# Patient Record
Sex: Female | Born: 1970
Health system: Southern US, Community
[De-identification: ages and names within clinical notes are randomized; demographics above are authoritative.]

## PROBLEM LIST (undated history)

## (undated) DIAGNOSIS — I10 Essential (primary) hypertension: Secondary | ICD-10-CM

## (undated) DIAGNOSIS — E119 Type 2 diabetes mellitus without complications: Secondary | ICD-10-CM

## (undated) DIAGNOSIS — E669 Obesity, unspecified: Secondary | ICD-10-CM

## (undated) HISTORY — PX: ROTATOR CUFF REPAIR: SHX139

## (undated) HISTORY — DX: Obesity, unspecified: E66.9

## (undated) HISTORY — DX: Essential (primary) hypertension: I10

## (undated) HISTORY — PX: TUBAL LIGATION: SHX77

## (undated) HISTORY — DX: Type 2 diabetes mellitus without complications: E11.9

## (undated) HISTORY — PX: HERNIA REPAIR: SHX51

---

## 1997-09-05 ENCOUNTER — Ambulatory Visit (HOSPITAL_COMMUNITY): Admission: RE | Admit: 1997-09-05 | Discharge: 1997-09-05 | Payer: Self-pay | Admitting: Obstetrics

## 1997-10-25 ENCOUNTER — Ambulatory Visit (HOSPITAL_COMMUNITY): Admission: RE | Admit: 1997-10-25 | Discharge: 1997-10-25 | Payer: Self-pay | Admitting: Obstetrics

## 1997-12-14 ENCOUNTER — Inpatient Hospital Stay (HOSPITAL_COMMUNITY): Admission: AD | Admit: 1997-12-14 | Discharge: 1997-12-14 | Payer: Self-pay | Admitting: Obstetrics

## 1998-01-03 ENCOUNTER — Other Ambulatory Visit: Admission: RE | Admit: 1998-01-03 | Discharge: 1998-01-03 | Payer: Self-pay | Admitting: Obstetrics

## 1998-01-06 ENCOUNTER — Encounter (HOSPITAL_COMMUNITY): Admission: RE | Admit: 1998-01-06 | Discharge: 1998-01-25 | Payer: Self-pay | Admitting: Obstetrics

## 1998-01-24 ENCOUNTER — Inpatient Hospital Stay (HOSPITAL_COMMUNITY): Admission: AD | Admit: 1998-01-24 | Discharge: 1998-01-27 | Payer: Self-pay | Admitting: Obstetrics

## 1998-03-23 ENCOUNTER — Emergency Department (HOSPITAL_COMMUNITY): Admission: EM | Admit: 1998-03-23 | Discharge: 1998-03-23 | Payer: Self-pay | Admitting: Emergency Medicine

## 2006-12-09 ENCOUNTER — Other Ambulatory Visit: Admission: RE | Admit: 2006-12-09 | Discharge: 2006-12-09 | Payer: Self-pay | Admitting: Family Medicine

## 2007-03-03 ENCOUNTER — Ambulatory Visit (HOSPITAL_COMMUNITY): Admission: RE | Admit: 2007-03-03 | Discharge: 2007-03-03 | Payer: Self-pay | Admitting: General Surgery

## 2007-09-22 ENCOUNTER — Emergency Department (HOSPITAL_COMMUNITY): Admission: EM | Admit: 2007-09-22 | Discharge: 2007-09-22 | Payer: Self-pay | Admitting: Family Medicine

## 2010-11-06 NOTE — Op Note (Signed)
Laura Gallegos, Laura Gallegos              ACCOUNT NO.:  [id number]   MEDICAL RECORD NO.:  [id number]          PATIENT TYPE:  AMB   LOCATION:  [facility]                         FACILITY:  [facility]   PHYSICIAN:  Leonie Man, M.D.   DATE OF BIRTH:  09/28/1970   DATE OF PROCEDURE:  03/03/2007  DATE OF DISCHARGE:                               OPERATIVE REPORT   PREOPERATIVE DIAGNOSIS:  Ventral umbilical hernia.   POSTOPERATIVE DIAGNOSIS:  Ventral umbilical hernia.   PROCEDURE:  Repair of incarcerated ventral umbilical hernia with mesh.   SURGEON:  Leonie Man, M.D.   ASSISTANT:  OR nurse.   ANESTHESIA:  General.   NOTE:  The patient is a 40 year old morbidly obese young woman who  presents with 3 episodes of epigastric discomfort, which are increased  after eating, associated with mild nausea but without vomiting.  On  evaluation by her primary care physician who felt she could palpate a  hernia with in the region of the umbilicus, she was referred for further  evaluation and treatment.  At the time of evaluation, she is noted to  have an epigastric hernia which extends all the way down into the  umbilicus.  The patient comes to the operating room now for repair.  She  understands the risks and potential benefits of surgery and she gives  her consent to same.   PROCEDURE:  The patient is positioned supinely and following the  induction of satisfactory general anesthesia, the abdomen is prepped and  draped to be included in the sterile operative field.  A midline  incision is carried down from within the umbilicus extending superiorly  into the epigastrium, deepened through the skin and subcutaneous tissues  and carried down through the region of the hernia.  The hernia was  identified and has multiple large portion of incarcerated omentum, which  is reduced back into the peritoneal cavity, having extended the hernial  defect slightly superiorly so as to accomplish this.  All adhesions to  the side wall were taken down and I then introduced a medium sized  Ventralex mesh into the defect and securing it on all sides to the  abdominal wall and trimming the tails off after they had been sewn to  the fascia.  This was accomplished with 0 Novafil sutures.  The fascia  was then closed over the mesh with a suture catching the mesh portion to  hold it against the abdominal wall with 0 Novafil.  This having been  accomplished, sponge, instruments and sharp counts were doubly verified.  The subcutaneous tissue was then closed with interrupted 3-0, 2-0 Vicryl  sutures and the skin was closed with a 4-0 Monocryl running  intradermal suture.  The wound was then reinforced with Steri-Strips,  sterile dressings applied, the anesthetic reversed and the patient  removed from the operating room to the recovery room in stable  condition.  She tolerated the procedure well.      Leonie Man, M.D.  Electronically Signed     PB/MEDQ  D:  03/03/2007  T:  03/03/2007  Job:  [id number]   cc:  Leonie Man, M.D.

## 2011-04-05 LAB — COMPREHENSIVE METABOLIC PANEL
ALT: 32
AST: 29
Albumin: 3.5
CO2: 29
Chloride: 106
Creatinine, Ser: 0.93
GFR calc Af Amer: >60
GFR calc non Af Amer: >60
Sodium: 140
Total Bilirubin: 0.7

## 2011-04-05 LAB — URINE CULTURE: Colony Count: 85000

## 2011-04-05 LAB — DIFFERENTIAL
Basophils Absolute: 0.1
Eosinophils Absolute: 0.2
Eosinophils Relative: 3
Lymphocytes Relative: 33
Lymphs Abs: 2.5
Monocytes Absolute: 0.4

## 2011-04-05 LAB — URINALYSIS, ROUTINE W REFLEX MICROSCOPIC
Glucose, UA: NEGATIVE
Hgb urine dipstick: NEGATIVE
pH: 5.5

## 2011-04-05 LAB — CBC
Platelets: 362
RBC: 4.36
WBC: 7.6

## 2011-04-05 LAB — PROTIME-INR: Prothrombin Time: 13.2

## 2011-05-29 DIAGNOSIS — Z0000 Encounter for general adult medical examination without abnormal findings: Secondary | ICD-10-CM | POA: Insufficient documentation

## 2011-05-30 ENCOUNTER — Other Ambulatory Visit (HOSPITAL_COMMUNITY)
Admission: RE | Admit: 2011-05-30 | Discharge: 2011-05-30 | Disposition: A | Discharge status: Home / Self Care | Payer: 59 | Source: Ambulatory Visit | Attending: Family Medicine | Admitting: Family Medicine

## 2011-06-27 ENCOUNTER — Encounter: Payer: Self-pay | Admitting: *Deleted

## 2011-06-27 ENCOUNTER — Encounter: Payer: 59 | Attending: Family Medicine | Admitting: *Deleted

## 2011-06-27 DIAGNOSIS — E669 Obesity, unspecified: Secondary | ICD-10-CM | POA: Insufficient documentation

## 2011-06-27 DIAGNOSIS — R7309 Other abnormal glucose: Secondary | ICD-10-CM | POA: Insufficient documentation

## 2011-06-27 DIAGNOSIS — Z713 Dietary counseling and surveillance: Secondary | ICD-10-CM | POA: Insufficient documentation

## 2011-06-27 NOTE — Progress Notes (Signed)
  Medical Nutrition Therapy:  Appt start time: 0900 end time:  1000.   Assessment:  Primary concerns today: Patient has dealt with obesity for many years and is here for elevated glucose and HgA1c consistent with pre-diabetes. She is busy transporting her children to activities after work, limited time for her own activities.   MEDICATIONS: see list   DIETARY INTAKE:  Usual eating pattern includes 3 meals and 0-1 snacks per day.  Everyday foods include easily prepared meals.  .    24-hr recall:  B ( AM): tries to eat cerea (sweetened) and water or coffee & creamer at home Snk ( AM): not usually  L ( PM): bring sandwich from home, salad or burger in cafe, sweet tea or water, diet soda occasionally Snk ( PM): none D ( PM): cook later in the evening; meat, starch and 1-2 vegetables, occasionally bread Snk ( PM): sweets; milk and cookies( 4) or cake Beverages: water, coffee, crystal light, sweet tea  Usual physical activity: limited due to schedule  Estimated energy needs: 1600 calories 180 g carbohydrates 120 g protein 44 g fat  Progress Towards Goal(s):  In progress.   Nutritional Diagnosis:  NI-1.5 Excessive energy intake As related to obesity.  As evidenced by BMI of 51.7%.    Intervention:  Nutrition counseling and explanation of pre-diabetes provided. Suggest decrease empty calories of high sugar beverages, increase fresh fruits and vegetables and whole grain snacks. Also suggest foods be eaten . Goals:  Eat 3 meals/day, Avoid meal skipping   Increase protein rich foods  Follow "Plate Method" for portion control  Limit carbohydrate1-2 servings/meal   Choose more whole grains, lean protein, low-fat dairy, and fruits/non-starchy vegetables.   Aim for >30 min of physical activity daily  Limit sugar-sweetened beverages and concentrated sweets  Handouts given during visit include:   Living Well with Diabetes  Carb Counting and Label Reading  handouts  Monitoring/Evaluation:  Dietary intake, exercise, label reading, and body weight in 4 week(s).

## 2011-07-01 ENCOUNTER — Encounter: Payer: Self-pay | Admitting: *Deleted

## 2011-07-01 NOTE — Patient Instructions (Signed)
Goals:  Eat 3 meals/day, Avoid meal skipping   Increase protein rich foods  Follow "Plate Method" for portion control  Limit carbohydrate1-2 servings/meal   Choose more whole grains, lean protein, low-fat dairy, and fruits/non-starchy vegetables.   Aim for >30 min of physical activity daily  Limit sugar-sweetened beverages and concentrated sweets   

## 2011-11-02 ENCOUNTER — Emergency Department (INDEPENDENT_AMBULATORY_CARE_PROVIDER_SITE_OTHER): Payer: 59

## 2011-11-02 ENCOUNTER — Encounter (HOSPITAL_COMMUNITY): Payer: Self-pay | Admitting: *Deleted

## 2011-11-02 ENCOUNTER — Emergency Department (HOSPITAL_COMMUNITY)
Admission: EM | Admit: 2011-11-02 | Discharge: 2011-11-02 | Disposition: A | Discharge status: Home / Self Care | Payer: 59 | Source: Home / Self Care | Attending: Emergency Medicine | Admitting: Emergency Medicine

## 2011-11-02 DIAGNOSIS — J209 Acute bronchitis, unspecified: Secondary | ICD-10-CM

## 2011-11-02 DIAGNOSIS — J039 Acute tonsillitis, unspecified: Secondary | ICD-10-CM

## 2011-11-02 MED ORDER — BENZONATATE 200 MG PO CAPS: 200.0000 mg | ORAL_CAPSULE | Freq: Three times a day (TID) | ORAL | Status: AC | PRN

## 2011-11-02 MED ORDER — ALBUTEROL SULFATE HFA 108 (90 BASE) MCG/ACT IN AERS: 1.0000 | INHALATION_SPRAY | Freq: Four times a day (QID) | RESPIRATORY_TRACT | Status: DC | PRN

## 2011-11-02 MED ORDER — AMOXICILLIN 500 MG PO CAPS: 1000.0000 mg | ORAL_CAPSULE | Freq: Three times a day (TID) | ORAL | Status: AC

## 2011-11-02 NOTE — ED Provider Notes (Signed)
Chief Complaint  Patient presents with  . Cough  . Nasal Congestion  . Generalized Body Aches    History of Present Illness:   The patient is a 41 year old female with two-week history of cough productive of small amounts of green sputum, wheezing, aching in her ribs when she coughs, nasal congestion with green drainage, and sinus pressure over the past 2 days she's had sore throat, low-grade fever of up to 100.2, chills, and sweats.  Review of Systems:  Other than noted above, the patient denies any of the following symptoms. Systemic:  No fever, chills, sweats, fatigue, myalgias, headache, or anorexia. Eye:  No redness, pain or drainage. ENT:  No earache, ear congestion, nasal congestion, sneezing, rhinorrhea, sinus pressure, sinus pain, post nasal drip, or sore throat. Lungs:  No cough, sputum production, wheezing, shortness of breath, or chest pain. GI:  No abdominal pain, nausea, vomiting, or diarrhea. Skin:  No rash or itching.  PMFSH:  Past medical history, family history, social history, meds, and allergies were reviewed.  Physical Exam:   Vital signs:  BP 139/87  Pulse 106  Temp(Src) 100.3 F (37.9 C) (Oral)  Resp 18  SpO2 99%  LMP 10/12/2011 General:  Alert, in no distress. Eye:  No conjunctival injection or drainage. Lids were normal. ENT:  TMs and canals were normal, without erythema or inflammation.  Nasal mucosa was clear and uncongested, without drainage.  Mucous membranes were moist.  Tonsils were slightly enlarged and red with spots of exudate on the right.  There were no oral ulcerations or lesions. Neck:  Supple, no adenopathy, tenderness or mass. Lungs:  No respiratory distress.  Lungs were clear to auscultation, without wheezes, rales or rhonchi.  Breath sounds were clear and equal bilaterally. Lungs were resonant to percussion.  No egophony. Heart:  Regular rhythm, without gallops, murmers or rubs. Skin:  Clear, warm, and dry, without rash or lesions.  Labs:     Results for orders placed during the hospital encounter of 11/02/11  POCT RAPID STREP A ([facility] ONLY)      Component Value Range   Streptococcus, Group A Screen (Direct) NEGATIVE  NEGATIVE     Radiology:  Dg Chest 2 View  11/02/2011  *RADIOLOGY REPORT*  Clinical Data: Cough.  CHEST - 2 VIEW  Comparison: Two-view chest 03/02/2007.  Findings: The heart size is normal.  The lungs are clear. Scoliosis is stable.  IMPRESSION: No acute cardiopulmonary disease.  Original Report Authenticated By: Jamesetta Orleans. MATTERN, M.D.    Assessment:  The primary encounter diagnosis was Tonsillitis. A diagnosis of Acute bronchitis was also pertinent to this visit.  Plan:   1.  The following meds were prescribed:   New Prescriptions   ALBUTEROL (PROVENTIL HFA;VENTOLIN HFA) 108 (90 BASE) MCG/ACT INHALER    Inhale 1-2 puffs into the lungs every 6 (six) hours as needed for wheezing.   AMOXICILLIN (AMOXIL) 500 MG CAPSULE    Take 2 capsules (1,000 mg total) by mouth 3 (three) times daily.   BENZONATATE (TESSALON) 200 MG CAPSULE    Take 1 capsule (200 mg total) by mouth 3 (three) times daily as needed for cough.   2.  The patient was instructed in symptomatic care and handouts were given. 3.  The patient was told to return if becoming worse in any way, if no better in 3 or 4 days, and given some red flag symptoms that would indicate earlier return.   Reuben Likes, MD 11/02/11 [phone]

## 2011-11-02 NOTE — ED Notes (Signed)
C/O congestion and cough x 2 wks that has become productive w/ greenish sputum over last 2 days.  Has been taking Coricidin HBP, Robitussin, and Mucinex.  C/O upper chest and throat soreness only when coughing.

## 2011-11-02 NOTE — Discharge Instructions (Signed)

## 2012-06-08 ENCOUNTER — Emergency Department (HOSPITAL_COMMUNITY)
Admission: EM | Admit: 2012-06-08 | Discharge: 2012-06-08 | Discharge status: Absent without leave | Payer: 59 | Source: Home / Self Care

## 2012-07-22 ENCOUNTER — Emergency Department (HOSPITAL_COMMUNITY): Payer: 59

## 2012-07-22 ENCOUNTER — Encounter (HOSPITAL_COMMUNITY): Payer: Self-pay | Admitting: Emergency Medicine

## 2012-07-22 ENCOUNTER — Emergency Department (HOSPITAL_COMMUNITY)
Admission: EM | Admit: 2012-07-22 | Discharge: 2012-07-22 | Disposition: A | Discharge status: Home / Self Care | Payer: 59 | Attending: Emergency Medicine | Admitting: Emergency Medicine

## 2012-07-22 DIAGNOSIS — I10 Essential (primary) hypertension: Secondary | ICD-10-CM | POA: Insufficient documentation

## 2012-07-22 DIAGNOSIS — R0789 Other chest pain: Secondary | ICD-10-CM | POA: Insufficient documentation

## 2012-07-22 DIAGNOSIS — Z862 Personal history of diseases of the blood and blood-forming organs and certain disorders involving the immune mechanism: Secondary | ICD-10-CM | POA: Insufficient documentation

## 2012-07-22 DIAGNOSIS — Z79899 Other long term (current) drug therapy: Secondary | ICD-10-CM | POA: Insufficient documentation

## 2012-07-22 DIAGNOSIS — R0602 Shortness of breath: Secondary | ICD-10-CM | POA: Insufficient documentation

## 2012-07-22 DIAGNOSIS — R079 Chest pain, unspecified: Secondary | ICD-10-CM

## 2012-07-22 DIAGNOSIS — Z8639 Personal history of other endocrine, nutritional and metabolic disease: Secondary | ICD-10-CM | POA: Insufficient documentation

## 2012-07-22 LAB — CBC WITH DIFFERENTIAL/PLATELET
Basophils Absolute: 0 10*3/uL (ref 0.0–0.1)
Basophils Relative: 1 % (ref 0–1)
Eosinophils Absolute: 0.2 10*3/uL (ref 0.0–0.7)
Eosinophils Relative: 3 % (ref 0–5)
Lymphs Abs: 2.9 10*3/uL (ref 0.7–4.0)
MCH: 30.8 pg (ref 26.0–34.0)
MCHC: 34.3 g/dL (ref 30.0–36.0)
MCV: 89.8 fL (ref 78.0–100.0)
Neutrophils Relative %: 49 % (ref 43–77)
Platelets: 326 10*3/uL (ref 150–400)
RDW: 12.6 % (ref 11.5–15.5)

## 2012-07-22 LAB — BASIC METABOLIC PANEL
Calcium: 9.3 mg/dL (ref 8.4–10.5)
GFR calc Af Amer: >90 mL/min (ref >90)
GFR calc non Af Amer: 85 mL/min — ABNORMAL LOW (ref >90)
Glucose, Bld: 134 mg/dL — ABNORMAL HIGH (ref 70–99)
Sodium: 139 mEq/L (ref 135–145)

## 2012-07-22 LAB — POCT I-STAT TROPONIN I

## 2012-07-22 MED ORDER — SODIUM CHLORIDE 0.9 % IV SOLN
INTRAVENOUS | Status: DC
  Administered 2012-07-22: 20 mL/h via INTRAVENOUS

## 2012-07-22 MED ORDER — IOHEXOL 350 MG/ML SOLN
100.0000 mL | Freq: Once | INTRAVENOUS | Status: AC | PRN
Start: 2012-07-22 — End: 2012-07-22
  Administered 2012-07-22: 100 mL via INTRAVENOUS

## 2012-07-22 MED ORDER — METHOCARBAMOL 750 MG PO TABS: 750.0000 mg | ORAL_TABLET | Freq: Four times a day (QID) | ORAL | Status: DC

## 2012-07-22 MED ORDER — IBUPROFEN 600 MG PO TABS: 600.0000 mg | ORAL_TABLET | Freq: Four times a day (QID) | ORAL | Status: DC | PRN

## 2012-07-22 NOTE — ED Provider Notes (Signed)
History     CSN: [id number]  Arrival date & time 07/22/12  0930   First MD Initiated Contact with Patient 07/22/12 0940      Chief Complaint  Patient presents with  . Chest Pain  . Shortness of Breath    (Consider location/radiation/quality/duration/timing/severity/associated sxs/prior treatment) Patient is a 42 y.o. female presenting with chest pain and shortness of breath. The history is provided by the patient.  Chest Pain Primary symptoms include shortness of breath.    Shortness of Breath  Associated symptoms include chest pain and shortness of breath.   patient here with acute onset of left-sided sharp chest pain which started prior to arrival. Symptoms last for seconds and nonradiating. No associated dyspnea or diaphoresis. No prior history of same. Pain is worse with taking deep breath. Denies any recent fever or cough. Denies the leg pain or swelling. No recent travel history. No medications used prior to arrival. Denies any rashes to her chest.  Past Medical History  Diagnosis Date  . Hypertension   . Obesity     Past Surgical History  Procedure Date  . Cesarean section   . Hernia repair     No family history on file.  History  Substance Use Topics  . Smoking status: Never Smoker   . Smokeless tobacco: Not on file  . Alcohol Use: No    OB History    Grav Para Term Preterm Abortions TAB SAB Ect Mult Living                  Review of Systems  Respiratory: Positive for shortness of breath.   Cardiovascular: Positive for chest pain.  All other systems reviewed and are negative.    Allergies  Review of patient's allergies indicates no known allergies.  Home Medications   Current Outpatient Rx  Name  Route  Sig  Dispense  Refill  . ALBUTEROL SULFATE HFA 108 (90 BASE) MCG/ACT IN AERS   Inhalation   Inhale 1-2 puffs into the lungs every 6 (six) hours as needed for wheezing.   1 Inhaler   0   . ADULT MULTIVITAMIN W/MINERALS CH   Oral   Take 1  tablet by mouth daily.         Marland Kitchen FISH OIL 1000 MG PO CAPS   Oral   Take 1 capsule by mouth daily.         . TRIAMTERENE-HCTZ 75-50 MG PO TABS   Oral   Take 2 tablets by mouth daily.         Marland Kitchen AMLODIPINE BESYLATE 5 MG PO TABS   Oral   Take 5 mg by mouth 2 (two) times daily.           Marland Kitchen LISINOPRIL-HYDROCHLOROTHIAZIDE 20-12.5 MG PO TABS   Oral   Take 2 tablets by mouth daily.             BP 139/75  Pulse 46  Temp 98.3 F (36.8 C) (Oral)  Resp 23  SpO2 100%  Physical Exam  Nursing note and vitals reviewed. Constitutional: She is oriented to person, place, and time. She appears well-developed and well-nourished.  Non-toxic appearance. No distress.  HENT:  Head: Normocephalic and atraumatic.  Eyes: Conjunctivae normal, EOM and lids are normal. Pupils are equal, round, and reactive to light.  Neck: Normal range of motion. Neck supple. No tracheal deviation present. No mass present.  Cardiovascular: Normal rate, regular rhythm and normal heart sounds.  Exam reveals no gallop.  No murmur heard. Pulmonary/Chest: Effort normal and breath sounds normal. No stridor. No respiratory distress. She has no decreased breath sounds. She has no wheezes. She has no rhonchi. She has no rales.  Abdominal: Soft. Normal appearance and bowel sounds are normal. She exhibits no distension. There is no tenderness. There is no rebound and no CVA tenderness.  Musculoskeletal: Normal range of motion. She exhibits no edema and no tenderness.  Neurological: She is alert and oriented to person, place, and time. She has normal strength. No cranial nerve deficit or sensory deficit. GCS eye subscore is 4. GCS verbal subscore is 5. GCS motor subscore is 6.  Skin: Skin is warm and dry. No abrasion and no rash noted.  Psychiatric: She has a normal mood and affect. Her speech is normal and behavior is normal.    ED Course  Procedures (including critical care time)  Labs Reviewed - No data to  display No results found.   No diagnosis found.    MDM   Date: 07/22/2012  Rate: 46  Rhythm: sinus bradycardia  QRS Axis: normal  Intervals: normal  ST/T Wave abnormalities: nonspecific T wave changes  Conduction Disutrbances:none  Narrative Interpretation:   Old EKG Reviewed: unchanged  12:01 PM Pt with negative w/u for pe, doubt acs DT patient's atypical nature of her symptoms. Patient will be placed on anti-inflammatories and muscle accidents and given return precautions        Toy Baker, MD 07/22/12 1201

## 2012-07-22 NOTE — ED Notes (Signed)
Pt c/o of left sided chest pain that started around 15 min ago on the way to work. Also c/o of SOB. States that this is a new onset of symptoms, never occuring before. Denies any jaw or arm pain. Hx of hypertension.

## 2012-08-08 ENCOUNTER — Other Ambulatory Visit: Payer: Self-pay

## 2012-12-22 ENCOUNTER — Other Ambulatory Visit: Payer: Self-pay | Admitting: Family Medicine

## 2012-12-22 DIAGNOSIS — R51 Headache: Secondary | ICD-10-CM

## 2012-12-26 ENCOUNTER — Other Ambulatory Visit: Payer: 59

## 2013-04-29 ENCOUNTER — Other Ambulatory Visit: Payer: Self-pay

## 2013-12-27 ENCOUNTER — Other Ambulatory Visit (HOSPITAL_COMMUNITY)
Admission: RE | Admit: 2013-12-27 | Discharge: 2013-12-27 | Disposition: A | Discharge status: Home / Self Care | Payer: 59 | Source: Ambulatory Visit | Attending: Family Medicine | Admitting: Family Medicine

## 2013-12-27 ENCOUNTER — Other Ambulatory Visit: Payer: Self-pay | Admitting: Family Medicine

## 2013-12-27 DIAGNOSIS — Z0000 Encounter for general adult medical examination without abnormal findings: Secondary | ICD-10-CM | POA: Insufficient documentation

## 2013-12-28 LAB — CYTOLOGY - PAP

## 2013-12-31 ENCOUNTER — Other Ambulatory Visit (HOSPITAL_COMMUNITY): Payer: Self-pay | Admitting: Family Medicine

## 2013-12-31 DIAGNOSIS — N950 Postmenopausal bleeding: Secondary | ICD-10-CM

## 2014-01-01 ENCOUNTER — Encounter: Payer: 59 | Attending: Family Medicine | Admitting: Dietician

## 2014-01-01 DIAGNOSIS — E669 Obesity, unspecified: Secondary | ICD-10-CM | POA: Insufficient documentation

## 2014-01-01 DIAGNOSIS — Z713 Dietary counseling and surveillance: Secondary | ICD-10-CM | POA: Insufficient documentation

## 2014-01-01 NOTE — Progress Notes (Signed)
Patient was seen on 01/01/2014 for the Weight Loss Class at the [facility]. The following learning objectives were met by the patient during this class:   Describe healthy choices in each food group  Describe portion size of foods  Use plate method for meal planning  Demonstrate how to read Nutrition Facts food label  Set realistic goals for weight loss, diet changes, and physical activity.   Goals:  1. Make healthy food choices in each food group.  2. Reduce portion size of foods.  3. Increase fruit and vegetable intake.  4. Use plate method for meal planning.  5. Increase physical activity.   Handouts given:  1. Weight loss tips 2. Meal plan/portion card 2. Plate method  2. Food label handout   

## 2014-01-13 ENCOUNTER — Ambulatory Visit (HOSPITAL_COMMUNITY)
Admission: RE | Admit: 2014-01-13 | Discharge: 2014-01-13 | Disposition: A | Discharge status: Home / Self Care | Payer: 59 | Source: Ambulatory Visit | Attending: Family Medicine | Admitting: Family Medicine

## 2014-01-13 DIAGNOSIS — N950 Postmenopausal bleeding: Secondary | ICD-10-CM | POA: Insufficient documentation

## 2014-03-18 ENCOUNTER — Other Ambulatory Visit: Payer: Self-pay | Admitting: Obstetrics & Gynecology

## 2014-04-08 ENCOUNTER — Other Ambulatory Visit: Payer: Self-pay

## 2014-05-28 ENCOUNTER — Encounter: Payer: 59 | Attending: Family Medicine | Admitting: Dietician

## 2014-05-28 DIAGNOSIS — Z713 Dietary counseling and surveillance: Secondary | ICD-10-CM | POA: Diagnosis present

## 2014-05-28 NOTE — Progress Notes (Signed)
Patient was seen on 05/28/2014 for the Weight Loss Class at the [facility]. The following learning objectives were met by the patient during this class:   Describe healthy choices in each food group  Describe portion size of foods  Use plate method for meal planning  Demonstrate how to read Nutrition Facts food label  Set realistic goals for weight loss, diet changes, and physical activity.   Goals:  1. Make healthy food choices in each food group.  2. Reduce portion size of foods.  3. Increase fruit and vegetable intake.  4. Use plate method for meal planning.  5. Increase physical activity.    Handouts given:   1. Nutrition Strategies for Weight Loss   2. Meal plan/portion card   3. MyPlate Planner   4. Weight Management Recipe Resources   5. Bake, Broil, Grill   

## 2014-07-30 ENCOUNTER — Ambulatory Visit: Payer: Self-pay

## 2014-09-24 ENCOUNTER — Ambulatory Visit: Payer: 59

## 2014-10-29 ENCOUNTER — Encounter: Payer: 59 | Attending: Family Medicine

## 2014-10-29 VITALS — Ht 68.0 in | Wt 217.6 lb

## 2014-10-29 DIAGNOSIS — E119 Type 2 diabetes mellitus without complications: Secondary | ICD-10-CM | POA: Insufficient documentation

## 2014-10-29 DIAGNOSIS — Z713 Dietary counseling and surveillance: Secondary | ICD-10-CM | POA: Insufficient documentation

## 2014-11-01 NOTE — Progress Notes (Signed)
Patient was seen on 10/29/14 for the complete diabetes self-management series at the [facility]. This is a part of the [facility] [employer].  Handouts given during class include:  Living Well with Diabetes book  Carb Counting and Meal Planning book  Meal Plan Card  Carbohydrate guide  Meal planning worksheet  Low Sodium Flavoring Tips  The diabetes portion plate  Low Carbohydrate Snack Suggestions  A1c to eAG Conversion Chart  Diabetes Medications  Stress Management  Diabetes Recommended Care Schedule  Diabetes Success Plan  Core Class Satisfaction Survey  The following learning objectives were met by the patient during this course:  Describe diabetes  State some common risk factors for diabetes  Defines the role of glucose and insulin  Identifies type of diabetes and pathophysiology  Describe the relationship between diabetes and cardiovascular risk  State the members of the Healthcare Team  States the rationale for glucose monitoring  State when to test glucose  State their individual Target Range  State the importance of logging glucose readings  Describe how to interpret glucose readings  Identifies A1C target  Explain the correlation between A1c and eAG values  State symptoms and treatment of high blood glucose  State symptoms and treatment of low blood glucose  Explain proper technique for glucose testing  Identifies proper sharps disposal  Describe the role of different macronutrients on glucose  Explain how carbohydrates affect blood glucose  State what foods contain the most carbohydrates  Demonstrate carbohydrate counting  Demonstrate how to read Nutrition Facts food label  Describe effects of various fats on heart health  Describe the importance of good nutrition for health and healthy eating strategies  Describe techniques for managing your shopping, cooking and meal planning  List  strategies to follow meal plan when dining out  Describe the effects of alcohol on glucose and how to use it safely . State the amount of activity recommended for healthy living . Describe activities suitable for individual needs . Identify ways to regularly incorporate activity into daily life . Identify barriers to activity and ways to over come these barriers  Identify diabetes medications being personally used and their primary action for lowering glucose and possible side effects . Describe role of stress on blood glucose and develop strategies to address psychosocial issues . Identify diabetes complications and ways to prevent them  Explain how to manage diabetes during illness . Evaluate success in meeting personal goal . Establish 2-3 goals that they will plan to diligently work on until they return for the  71-monthfollow-up visit  Goals:   I will count my carb choices at most meals and snacks  I will be active  3 times a week  I will eat less unhealthy fats by eating less fast food  I will test my glucose at least 2-3 times a day, 4-5 days a week  I will look at patterns in my record book at least 4 days a month  To help manage stress I will  Do meditation at least 2 times a week  Walk more  Your patient has identified these potential barriers to change:  Motivation Today I am motivated and I'll try to keep that mindset. Use the fact that I don't want to become insulin dependent as my motivator  Your patient has identified their diabetes self-care support plan as  Family Support  Plan: Follow up with [facility] [employer]

## 2014-12-16 ENCOUNTER — Telehealth: Payer: Self-pay

## 2014-12-19 ENCOUNTER — Other Ambulatory Visit: Payer: Self-pay

## 2015-03-01 ENCOUNTER — Other Ambulatory Visit: Payer: Self-pay | Admitting: *Deleted

## 2015-03-01 VITALS — BP 125/82 | Ht 64.0 in | Wt 291.4 lb

## 2015-03-01 DIAGNOSIS — I10 Essential (primary) hypertension: Secondary | ICD-10-CM

## 2015-03-02 ENCOUNTER — Encounter: Payer: Self-pay | Admitting: *Deleted

## 2015-03-02 DIAGNOSIS — E119 Type 2 diabetes mellitus without complications: Secondary | ICD-10-CM | POA: Insufficient documentation

## 2015-03-02 DIAGNOSIS — I10 Essential (primary) hypertension: Secondary | ICD-10-CM | POA: Insufficient documentation

## 2015-03-03 ENCOUNTER — Encounter: Payer: Self-pay | Admitting: *Deleted

## 2015-03-03 NOTE — Patient Outreach (Signed)
[place] [facility]) Care Management   03/01/2015  Laura Gallegos [birth year] [id number]  Laura Gallegos is an 44 y.o. female who presents to the [place] office to enroll in the Link To Wellness program for self management assistance of Type II DM and HTN.  Subjective:  States she was recently prescribed Metformin for Type II DM diagnosed last year after she was unable to consistently make lifestyle changes related to meal planning, weight loss and exercise. Says she still struggles with meal planning and consistent exercise and also to forgets to take the Metformin 2-3 times per week.  Objective:   Review of Systems  Constitutional: Negative.     Physical Exam  Constitutional: She is oriented to person, place, and time. She appears well-developed and well-nourished.  Neurological: She is alert and oriented to person, place, and time.  Skin: Skin is warm and dry.  Psychiatric: She has a normal mood and affect. Her behavior is normal. Judgment and thought content normal.   Filed Vitals:   03/01/15 0930  BP: 125/82   Filed Weights   03/01/15 0930  Weight: 291 lb 6.4 oz (132.178 kg)    Current Medications:   Current Outpatient Prescriptions  Medication Sig Dispense Refill  . ibuprofen (ADVIL,MOTRIN) 600 MG tablet Take 1 tablet (600 mg total) by mouth every 6 (six) hours as needed for pain. 30 tablet 0  . lisinopril-hydrochlorothiazide (PRINZIDE,ZESTORETIC) 20-12.5 MG per tablet Take 2 tablets by mouth daily.      Marland Kitchen albuterol (PROVENTIL HFA;VENTOLIN HFA) 108 (90 BASE) MCG/ACT inhaler Inhale 1-2 puffs into the lungs every 6 (six) hours as needed for wheezing. 1 Inhaler 0  . amLODipine (NORVASC) 5 MG tablet Take 5 mg by mouth 2 (two) times daily.      . methocarbamol (ROBAXIN-750) 750 MG tablet Take 1 tablet (750 mg total) by mouth 4 (four) times daily. (Patient not taking: Reported on 03/02/2015) 30 tablet 0  . Multiple Vitamin (MULTIVITAMIN WITH MINERALS) TABS  Take 1 tablet by mouth daily.    . Omega-3 Fatty Acids (FISH OIL) 1000 MG CAPS Take 1 capsule by mouth daily.    Marland Kitchen triamterene-hydrochlorothiazide (MAXZIDE) 75-50 MG per tablet Take 2 tablets by mouth daily.     No current facility-administered medications for this visit.    Functional Status:   In your present state of health, do you have any difficulty performing the following activities: 03/01/2015  Hearing? N  Vision? N  Difficulty concentrating or making decisions? N  Walking or climbing stairs? N  Dressing or bathing? N  Doing errands, shopping? N    Fall/Depression Screening:    PHQ 2/9 Scores 03/01/2015 11/01/2014  PHQ - 2 Score 0 0   [facility] CM Care Plan Problem One        Most Recent Value   Care Plan Problem One  Type II DM not meeting target A1C of <7.0% ( current a1C= 7.6%) and with knowledge deficit related to self management strategies   Role Documenting the Problem One  Care Management [place] for Problem One  Active   [facility] Long Term Goal (31-90 days)  Improved glycemic control as evidenced by improved A1C of <7.5% with correctly demonstrated self management skills related to meal planning/CHO counting, medication adherence, CBG checks at least 5x weekly, and exercise compliance at next Link To Wellness visit  in December   [facility] Long Term Goal Start Date  03/01/15   Interventions for Problem One  Long Term Goal  Discussed Link to Wellness program goals, requirements and benefits, reviewed member's rights and responsibilities ,provided diabetes information packet with explanation of contents, ensured member agreed and signed consent to participate and authorization to release and receive health information, consent, participation agreement and consent to enroll in program  assessed member's current knowledge of diabetes, using a picture representation, discussed the 8 core pathophysiologic deficits in Type II diabetes. Discussed physiology of diabetes as a chronic  progressive disease with the initial problem of insulin resistance in the muscle, liver and fat cells and then increased loss of beta cell function over time resulting in decreased insulin production, discussed role of obesity, especially abdominal (visceral) obesity, on insulin resistance, reviewed patient medications, discussed DM medication of Metformin including the mechanism of action, common side effects, dosages and dosing schedule, reinforced importance of taking all medications as prescribed, provided education on the three primary macronutrients (CHO, protein, fat) and their effect on glucose levels,  discussed phone apps to use to help with CHO counting when food labels are not available, discussed nutritional counseling benefit provided by Leroy and encouraged patient to see dietician to assist with dietary management of diabetes, discussed effects of physical activity on glucose levels and long-term glucose control by improving insulin sensitivity and assisting with weight management and cardiovascular health, explained to member how to obtain a glucometer and testing supplies at no cost since initial required program education is complete, discussed recommended target ranges for pre-meal and post-meal, , provided blood sugar log sheets with targets for pre and post meal, provided information on: prevention, detection, and treatment of long-term complications, discussed the role of prolonged elevated glucose levels on body systems, discussed recommendations for day to day and long-term diabetes self-care, reviewed recommended daily foot checks, and yearly cholesterol, urine, and eye testing, and checklist for medical, dental, and emotional self-care,  provided information on: prevention, detection, and treatment of long-term complications, discussed the role of prolonged elevated glucose levels on body systems, arranged for Link To Wellness follow up in December     Assessment:    [employer] employee with Type II DM and HTN enrolling in the Link To Wellness program for chronic disease self management assistance, BP meeting treatment target, most recent A1C of 7.6% not meeting target of <7.0%.   Plan:  RNCM to fax today's office visit note to Dr. Dema Severin. RNCM will meet quarterly and as needed with patient per Link To Wellness program guidelines to assist with HTN and Type II DM self-management and assess patient's progress toward mutually set goals.  Barrington Ellison RN,CCM,CDE [place] Management Coordinator Link To Wellness Office Phone [phone] Office Fax [phone]

## 2015-06-01 ENCOUNTER — Ambulatory Visit: Payer: 59 | Admitting: *Deleted

## 2015-06-27 ENCOUNTER — Ambulatory Visit: Payer: Self-pay | Admitting: *Deleted

## 2015-07-20 DIAGNOSIS — M62838 Other muscle spasm: Secondary | ICD-10-CM | POA: Diagnosis not present

## 2015-07-20 DIAGNOSIS — S39012A Strain of muscle, fascia and tendon of lower back, initial encounter: Secondary | ICD-10-CM | POA: Diagnosis not present

## 2015-07-20 MED FILL — CYCLOBENZAPRINE 10 MG TAB: 10 | 7 days supply | Qty: 20 | Fill #0

## 2015-07-20 MED FILL — IBUPROFEN 800 MG TABLET: 800 | 10 days supply | Qty: 30 | Fill #0

## 2015-07-21 ENCOUNTER — Other Ambulatory Visit: Payer: Self-pay | Admitting: *Deleted

## 2015-07-21 ENCOUNTER — Encounter: Payer: Self-pay | Admitting: *Deleted

## 2015-07-21 VITALS — BP 100/60 | Ht 64.0 in | Wt 295.4 lb

## 2015-07-21 DIAGNOSIS — E119 Type 2 diabetes mellitus without complications: Secondary | ICD-10-CM
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
## 2015-08-03 MED FILL — AMLODIPINE BESYLATE 5 MG TA: 5 | 90 days supply | Qty: 90 | Fill #0

## 2015-08-03 MED FILL — LISINOPRIL-HCTZ 20-12.5 MG: 20-12.5 | 90 days supply | Qty: 180 | Fill #0

## 2015-08-10 MED FILL — TRUE METRIX GLUCOSE TEST ST: 50 days supply | Qty: 100 | Fill #0

## 2015-08-11 MED FILL — TRUEplus LANCETS 30G MISC: 50 days supply | Qty: 100 | Fill #0

## 2015-09-15 DIAGNOSIS — Z7984 Long term (current) use of oral hypoglycemic drugs: Secondary | ICD-10-CM | POA: Diagnosis not present

## 2015-09-15 DIAGNOSIS — Z6841 Body Mass Index (BMI) 40.0 and over, adult: Secondary | ICD-10-CM | POA: Diagnosis not present

## 2015-09-15 DIAGNOSIS — I10 Essential (primary) hypertension: Secondary | ICD-10-CM | POA: Diagnosis not present

## 2015-09-15 DIAGNOSIS — J301 Allergic rhinitis due to pollen: Secondary | ICD-10-CM | POA: Diagnosis not present

## 2015-09-15 DIAGNOSIS — E119 Type 2 diabetes mellitus without complications: Secondary | ICD-10-CM | POA: Diagnosis not present

## 2015-09-26 ENCOUNTER — Telehealth: Payer: Self-pay

## 2015-10-13 ENCOUNTER — Other Ambulatory Visit: Payer: Self-pay | Admitting: *Deleted

## 2015-10-13 VITALS — BP 116/78 | Wt 295.6 lb

## 2015-10-13 DIAGNOSIS — E119 Type 2 diabetes mellitus without complications: Secondary | ICD-10-CM
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
## 2015-12-11 MED FILL — LISINOPRIL-HCTZ 20-12.5 MG: 20-12.5 | 90 days supply | Qty: 180 | Fill #1

## 2015-12-11 MED FILL — AMLODIPINE BESYLATE 5 MG TA: 5 | 90 days supply | Qty: 90 | Fill #1

## 2016-01-07 IMAGING — US US PELVIS COMPLETE
1 series · 13 of 25 positions shown · non-contrast
Comparison: None.

CLINICAL DATA: Postmenopausal bleeding.

EXAM:
TRANSABDOMINAL AND TRANSVAGINAL ULTRASOUND OF PELVIS
TECHNIQUE: Both transabdominal and transvaginal ultrasound examinations of the
pelvis were performed. Transabdominal technique was performed for
global imaging of the pelvis including uterus, ovaries, adnexal
regions, and pelvic cul-de-sac. It was necessary to proceed with
endovaginal exam following the transabdominal exam to visualize the
uterus, endometrium, ovaries and adnexal regions.

[Series 1: us pelvis complete · 0.22mm/px · 13 of 48 slices shown]
[im 1/48]
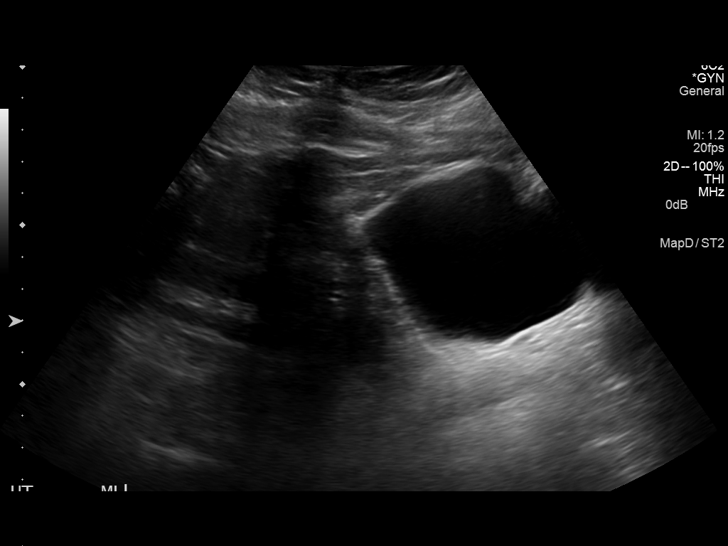
[im 4/48]
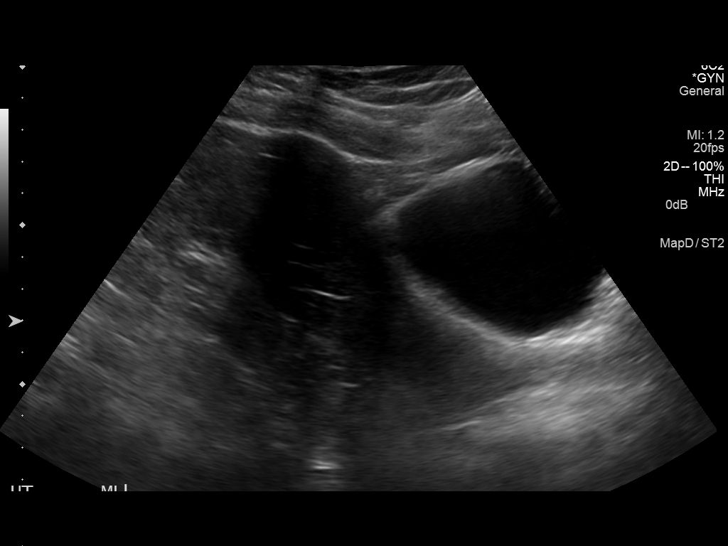
[im 8/48]
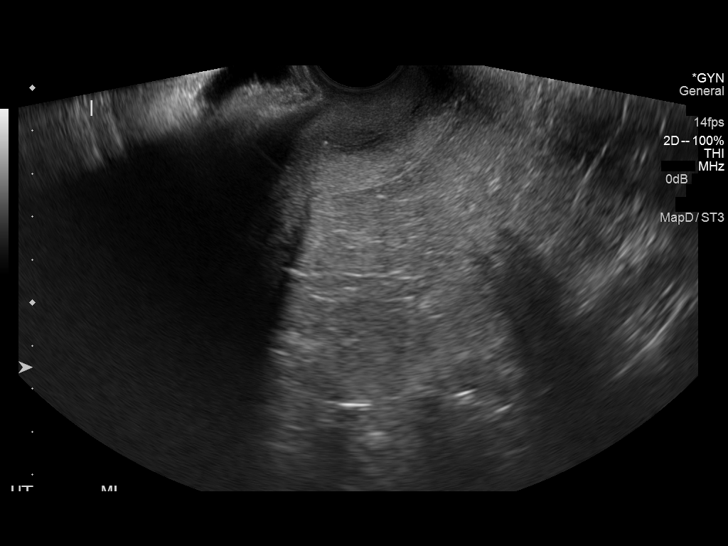
[im 12/48]
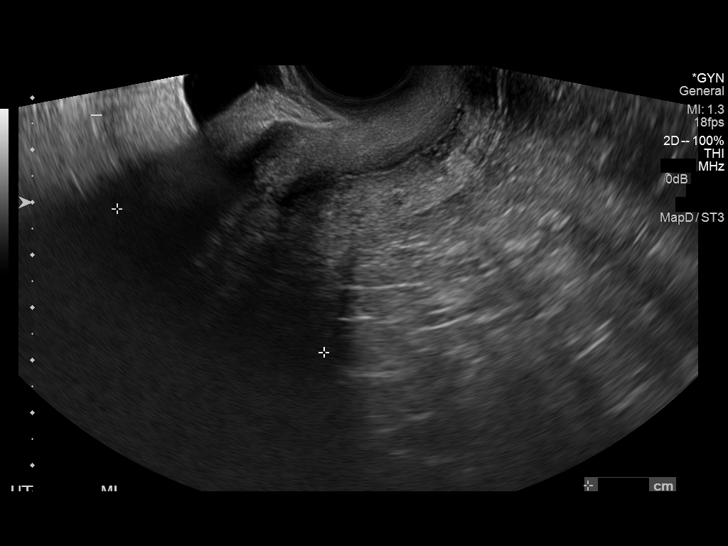
[im 16/48]
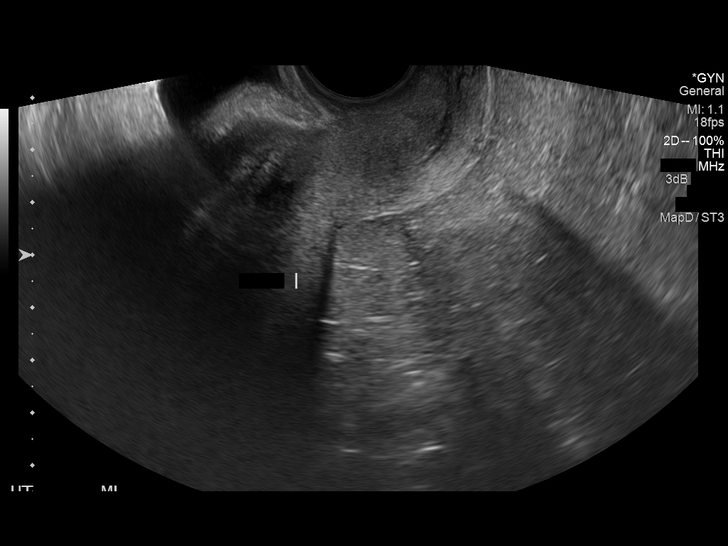
[im 20/48]
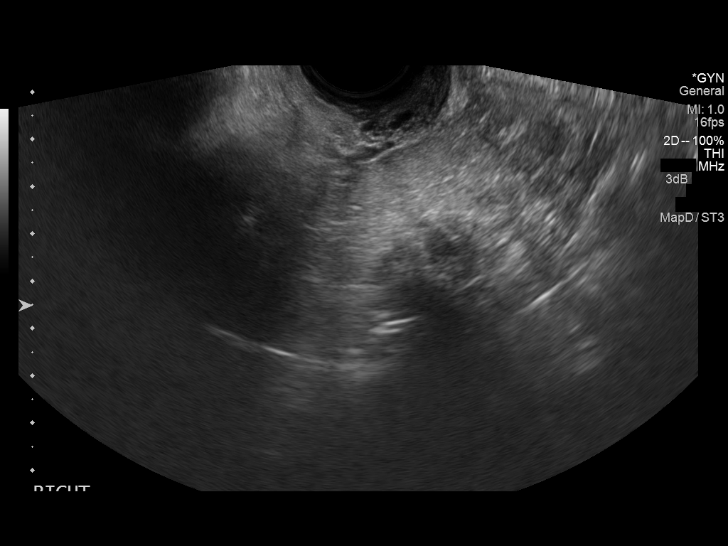
[im 24/48]
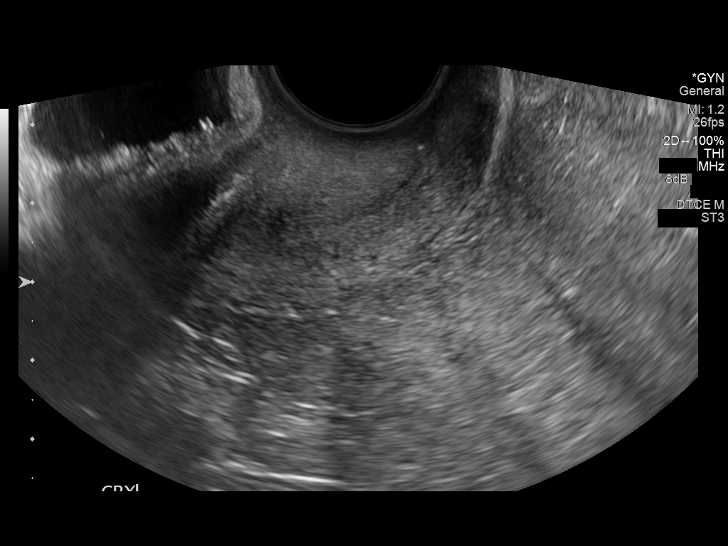
[im 28/48]
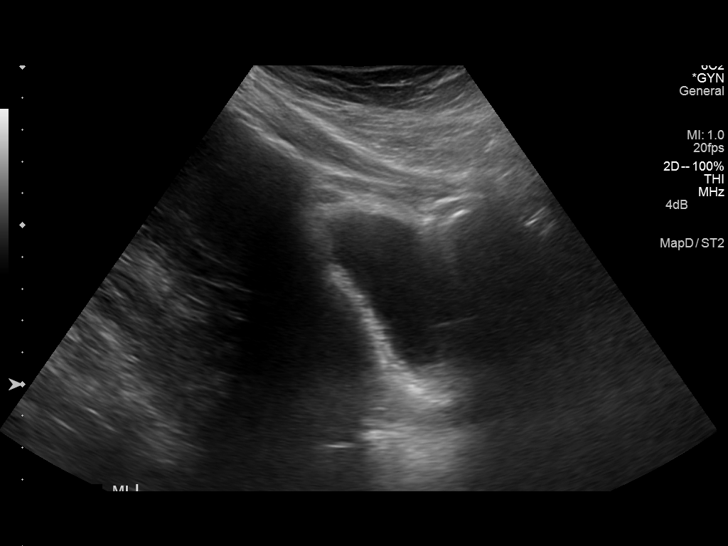
[im 32/48]
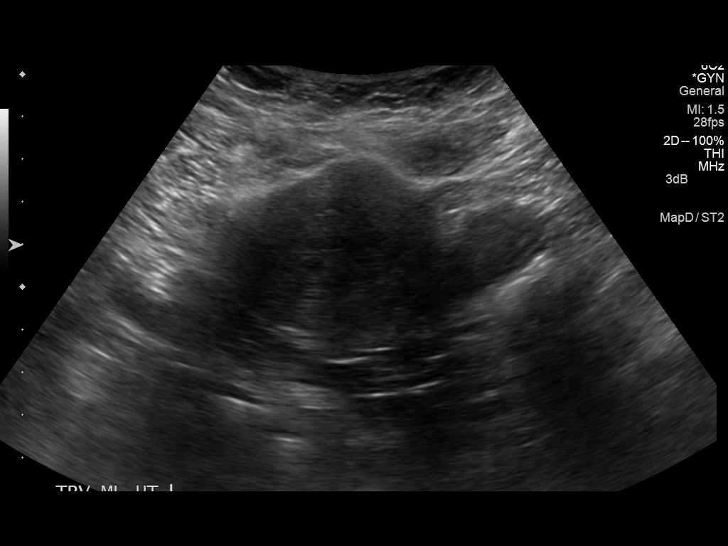
[im 36/48]
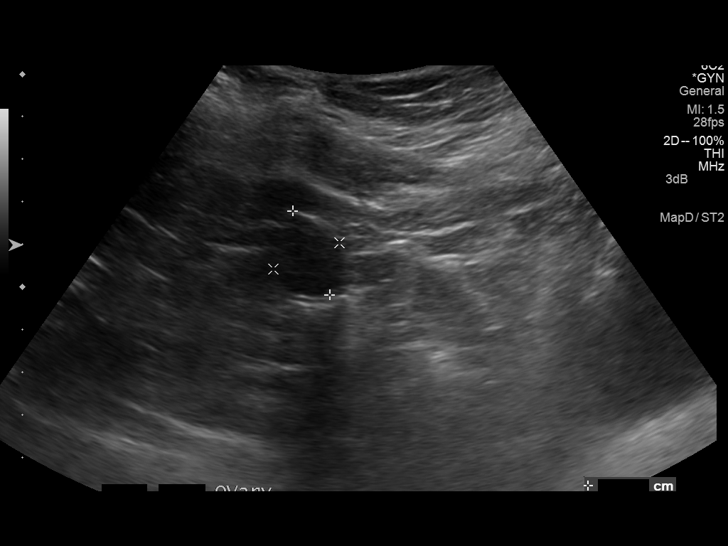
[im 40/48]
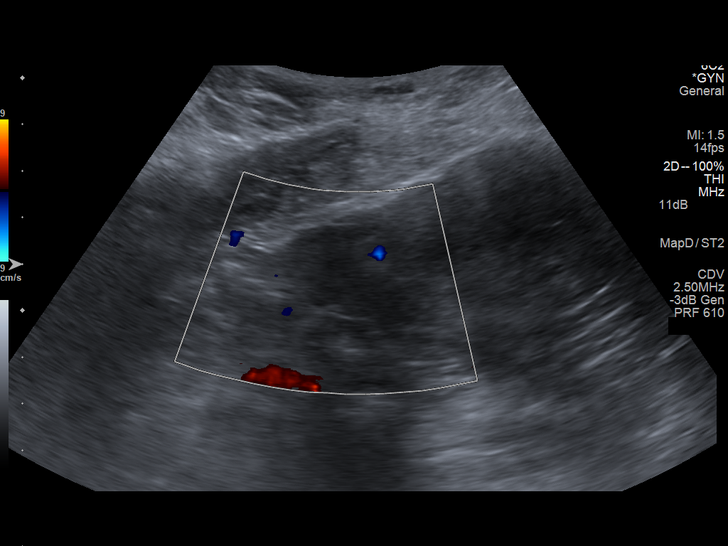
[im 44/48]
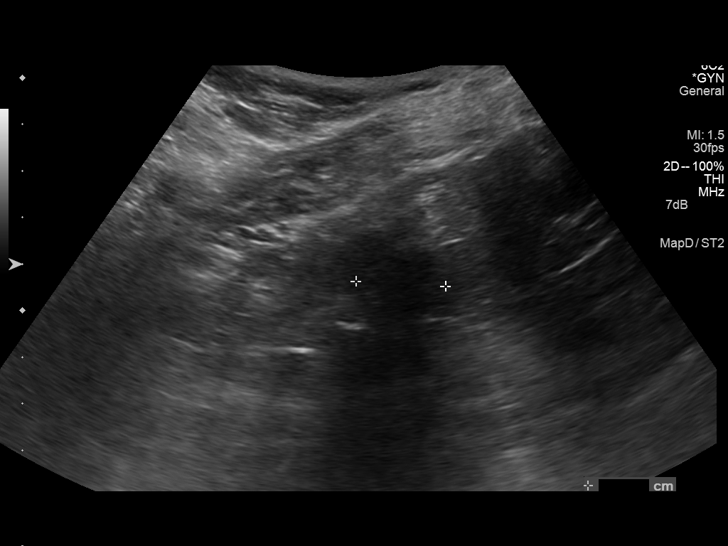
[im 48/48]
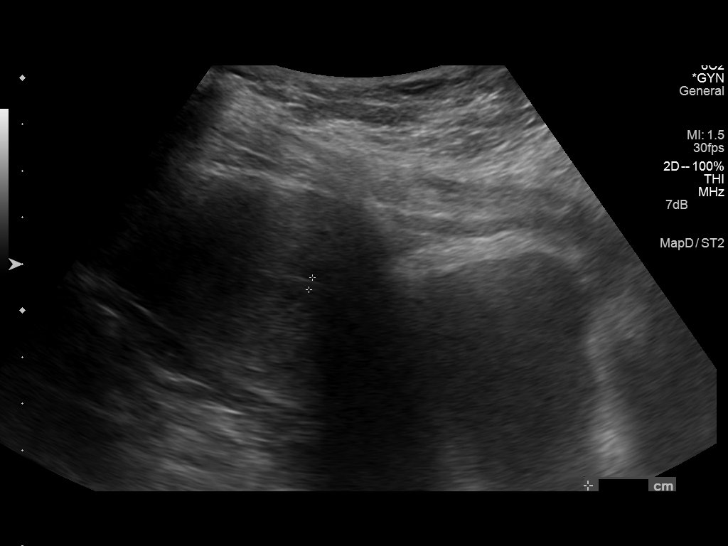

[13 of 25 positions shown; findings below may reference images not displayed]

FINDINGS: Uterus

Measurements: Approximately 10.4 x 4.8 cm. Poorly visualized due to
body habitus.

Endometrium

Thickness: Possibly up to 8 mm. Poorly visualized due to body
habitus.

Right ovary

Measurements: Approximately 2.2 x 1.7 x 1.7 cm. Poorly visualized
due to body habitus.

Left ovary

Measurements: Approximately 2.2 x 1.7 x 1.7 cm. Poorly visualized
due to body habitus.

Other findings

No free fluid.
IMPRESSION: 1. Image quality is severely compromised by body habitus, with
pelvic structures being poorly visualized.
2. Endometrium may measure up to 8 mm, which would be considered
abnormal in the setting of postmenopausal bleeding. In the setting
of post-menopausal bleeding, endometrial sampling is indicated to
exclude carcinoma. If results are benign, sonohysterogram should be
considered for focal lesion work-up. (Ref: Radiological Reasoning:
Algorithmic Workup of Abnormal Vaginal Bleeding with Endovaginal
Sonography and Sonohysterography. AJR [id number]; 191:S68-73).

## 2016-01-15 ENCOUNTER — Other Ambulatory Visit: Payer: Self-pay | Admitting: *Deleted

## 2016-01-15 DIAGNOSIS — E119 Type 2 diabetes mellitus without complications: Secondary | ICD-10-CM

## 2016-01-15 NOTE — Patient Outreach (Signed)
Landa was last seen in April for Link To Wellness follow up. She was scheduled to see her primary  care MD 7/21 but the appointment was rescheduled to 9/5. Since her Hgb A1C is not meeting target, it was 7.6 on 09/15/15, this RNCM called and left message on Anacaren's cell phone requesting she call and schedule a Link To Wellness appointment in August.  Braidon Chermak S. Rayhaan Huster RN,CCM,CDE [place] Management Coordinator Link To Wellness Office Phone [phone] Office Fax [phone]

## 2016-01-22 DIAGNOSIS — M25552 Pain in left hip: Secondary | ICD-10-CM | POA: Diagnosis not present

## 2016-01-22 MED FILL — predniSONE 10 MG TABS: 10 | 6 days supply | Qty: 21 | Fill #0

## 2016-02-22 ENCOUNTER — Encounter: Payer: Self-pay | Admitting: *Deleted

## 2016-02-22 ENCOUNTER — Other Ambulatory Visit: Payer: Self-pay | Admitting: *Deleted

## 2016-02-22 DIAGNOSIS — E119 Type 2 diabetes mellitus without complications: Secondary | ICD-10-CM
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
## 2016-02-27 DIAGNOSIS — I10 Essential (primary) hypertension: Secondary | ICD-10-CM | POA: Diagnosis not present

## 2016-02-27 DIAGNOSIS — E119 Type 2 diabetes mellitus without complications: Secondary | ICD-10-CM | POA: Diagnosis not present

## 2016-02-27 DIAGNOSIS — M25552 Pain in left hip: Secondary | ICD-10-CM | POA: Diagnosis not present

## 2016-02-27 DIAGNOSIS — E1165 Type 2 diabetes mellitus with hyperglycemia: Secondary | ICD-10-CM | POA: Diagnosis not present

## 2016-02-27 MED FILL — METFORMIN HCL ER 500 MG TAB: 500 | 90 days supply | Qty: 360 | Fill #0

## 2016-02-27 MED FILL — NAPROXEN 500 MG TABLET: 500 | 30 days supply | Qty: 60 | Fill #0

## 2016-03-20 ENCOUNTER — Telehealth: Payer: 59 | Admitting: Nurse Practitioner

## 2016-03-20 DIAGNOSIS — L03116 Cellulitis of left lower limb: Secondary | ICD-10-CM | POA: Diagnosis not present

## 2016-03-20 MED ORDER — DOXYCYCLINE HYCLATE 100 MG PO TABS: 100.0000 mg | ORAL_TABLET | Freq: Two times a day (BID) | ORAL | 0 refills | Status: DC

## 2016-03-20 MED FILL — DOXYCYCLINE HYCLATE 100 MG: 100 | 10 days supply | Qty: 20 | Fill #0

## 2016-03-20 NOTE — Progress Notes (Signed)
E Visit for Rash  We are sorry that you are not feeling well. Here is how we plan to help!    Rash sounds like infected big bite. Take antibiotic rx and if is no better tomorrow then needs to see PCP.  Doxycycline 100 mg twice per day for 7 days   HOME CARE:   Take cool showers and avoid direct sunlight.  Apply cool compress or wet dressings.  Take a bath in an oatmeal bath.  Sprinkle content of one Aveeno packet under running faucet with comfortably warm water.  Bathe for 15-20 minutes, 1-2 times daily.  Pat dry with a towel. Do not rub the rash.  Use hydrocortisone cream.  Take an antihistamine like Benadryl for widespread rashes that itch.  The adult dose of Benadryl is 25-50 mg by mouth 4 times daily.  Caution:  This type of medication may cause sleepiness.  Do not drink alcohol, drive, or operate dangerous machinery while taking antihistamines.  Do not take these medications if you have prostate enlargement.  Read package instructions thoroughly on all medications that you take.  GET HELP RIGHT AWAY IF:   Symptoms don't go away after treatment.  Severe itching that persists.  If you rash spreads or swells.  If you rash begins to smell.  If it blisters and opens or develops a yellow-brown crust.  You develop a fever.  You have a sore throat.  You become short of breath.  MAKE SURE YOU:  Understand these instructions. Will watch your condition. Will get help right away if you are not doing well or get worse.  Thank you for choosing an e-visit. Your e-visit answers were reviewed by a board certified advanced clinical practitioner to complete your personal care plan. Depending upon the condition, your plan could have included both over the counter or prescription medications. Please review your pharmacy choice. Be sure that the pharmacy you have chosen is open so that you can pick up your prescription now.  If there is a problem you may message your provider in  [place] to have the prescription routed to another pharmacy. Your safety is important to [place]. If you have drug allergies check your prescription carefully.  For the next 24 hours, you can use MyChart to ask questions about today's visit, request a non-urgent call back, or ask for a work or school excuse from your e-visit provider. You will get an email in the next two days asking about your experience. I hope that your e-visit has been valuable and will speed your recovery.

## 2016-04-15 DIAGNOSIS — J069 Acute upper respiratory infection, unspecified: Secondary | ICD-10-CM | POA: Diagnosis not present

## 2016-04-15 DIAGNOSIS — B9789 Other viral agents as the cause of diseases classified elsewhere: Secondary | ICD-10-CM | POA: Diagnosis not present

## 2016-04-19 MED FILL — AMOXICILLIN 875 MG TABLET: 875 | 10 days supply | Qty: 20 | Fill #0

## 2016-05-06 DIAGNOSIS — R001 Bradycardia, unspecified: Secondary | ICD-10-CM | POA: Diagnosis not present

## 2016-05-06 DIAGNOSIS — R9431 Abnormal electrocardiogram [ECG] [EKG]: Secondary | ICD-10-CM | POA: Diagnosis not present

## 2016-05-06 DIAGNOSIS — I951 Orthostatic hypotension: Secondary | ICD-10-CM | POA: Diagnosis not present

## 2016-05-20 MED FILL — LISINOPRIL-HCTZ 20-12.5 MG: 20-12.5 | 90 days supply | Qty: 180 | Fill #0

## 2016-06-07 ENCOUNTER — Other Ambulatory Visit: Payer: Self-pay | Admitting: *Deleted

## 2016-06-07 DIAGNOSIS — E119 Type 2 diabetes mellitus without complications: Secondary | ICD-10-CM

## 2016-06-07 DIAGNOSIS — H524 Presbyopia: Secondary | ICD-10-CM | POA: Diagnosis not present

## 2016-06-08 ENCOUNTER — Encounter: Payer: Self-pay | Admitting: *Deleted
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
## 2016-07-18 DIAGNOSIS — E1165 Type 2 diabetes mellitus with hyperglycemia: Secondary | ICD-10-CM | POA: Diagnosis not present

## 2016-07-18 DIAGNOSIS — I10 Essential (primary) hypertension: Secondary | ICD-10-CM | POA: Diagnosis not present

## 2016-07-18 DIAGNOSIS — R001 Bradycardia, unspecified: Secondary | ICD-10-CM | POA: Diagnosis not present

## 2016-07-18 DIAGNOSIS — E119 Type 2 diabetes mellitus without complications: Secondary | ICD-10-CM | POA: Diagnosis not present

## 2016-07-18 DIAGNOSIS — Z7984 Long term (current) use of oral hypoglycemic drugs: Secondary | ICD-10-CM | POA: Diagnosis not present

## 2016-07-18 DIAGNOSIS — M25512 Pain in left shoulder: Secondary | ICD-10-CM | POA: Diagnosis not present

## 2016-07-18 MED FILL — NAPROXEN 500 MG TABLET: 500 | 30 days supply | Qty: 60 | Fill #0

## 2016-07-18 MED FILL — METFORMIN HCL ER 500 MG TAB: 500 | 90 days supply | Qty: 360 | Fill #0

## 2016-08-28 DIAGNOSIS — Z1231 Encounter for screening mammogram for malignant neoplasm of breast: Secondary | ICD-10-CM | POA: Diagnosis not present

## 2016-08-28 MED FILL — LISINOPRIL-HCTZ 20-12.5 MG: 20-12.5 | 90 days supply | Qty: 180 | Fill #1

## 2016-09-30 DIAGNOSIS — M7542 Impingement syndrome of left shoulder: Secondary | ICD-10-CM | POA: Diagnosis not present

## 2016-10-30 MED FILL — ACCU-CHEK GUIDE TEST STRIP: 90 days supply | Qty: 100 | Fill #0

## 2016-10-30 MED FILL — ACCU-CHEK FASTCLIX LANCETS: 90 days supply | Qty: 102 | Fill #0

## 2016-10-30 MED FILL — SM ALCOHOL 70% PREP PADS: 70 | 90 days supply | Qty: 100 | Fill #0

## 2016-11-29 MED FILL — LISINOPRIL-HCTZ 20-12.5 MG: 20-12.5 | 90 days supply | Qty: 180 | Fill #0

## 2016-12-11 DIAGNOSIS — E119 Type 2 diabetes mellitus without complications: Secondary | ICD-10-CM | POA: Diagnosis not present

## 2016-12-11 DIAGNOSIS — I10 Essential (primary) hypertension: Secondary | ICD-10-CM | POA: Diagnosis not present

## 2016-12-11 DIAGNOSIS — Z7984 Long term (current) use of oral hypoglycemic drugs: Secondary | ICD-10-CM | POA: Diagnosis not present

## 2016-12-11 MED FILL — METFORMIN HCL ER 500 MG TAB: 500 | 90 days supply | Qty: 360 | Fill #0

## 2017-04-11 MED FILL — LISINOPRIL-HCTZ 20-12.5 MG: 20-12.5 | 90 days supply | Qty: 180 | Fill #0

## 2017-05-22 ENCOUNTER — Other Ambulatory Visit: Payer: Self-pay | Admitting: *Deleted

## 2017-05-22 NOTE — Patient Outreach (Signed)
Laura Gallegos transitioned from the [employer] To Wellness program to the [employer] on 07/17/16 for Type II diabetes self-management assistance so will close case to the diabetes Link To Wellness program due to delegation of disease management services to [employer] from [employer] for [place] members in 2019. Barrington Ellison RN,CCM,CDE [place] Management Coordinator Link To Wellness and [employer] [phone] Office Fax [phone]

## 2017-07-14 ENCOUNTER — Telehealth: Payer: 59 | Admitting: Family

## 2017-07-14 DIAGNOSIS — B9689 Other specified bacterial agents as the cause of diseases classified elsewhere: Secondary | ICD-10-CM

## 2017-07-14 DIAGNOSIS — J028 Acute pharyngitis due to other specified organisms: Secondary | ICD-10-CM

## 2017-07-14 MED ORDER — BENZONATATE 100 MG PO CAPS: 100.0000 mg | ORAL_CAPSULE | Freq: Three times a day (TID) | ORAL | 0 refills | Status: DC | PRN

## 2017-07-14 MED ORDER — FLUTICASONE PROPIONATE 50 MCG/ACT NA SUSP: 2.0000 | Freq: Every day | NASAL | 6 refills | Status: DC

## 2017-07-14 MED ORDER — PREDNISONE 5 MG PO TABS: 5.0000 mg | ORAL_TABLET | ORAL | 0 refills | Status: DC

## 2017-07-14 MED ORDER — AZITHROMYCIN 250 MG PO TABS: ORAL_TABLET | ORAL | 0 refills | Status: DC

## 2017-07-14 MED FILL — AZITHROMYCIN 250 MG TABLET: 250 | 5 days supply | Qty: 6 | Fill #0

## 2017-07-14 MED FILL — BENZONATATE 100 MG CAPS: 100 | 5 days supply | Qty: 30 | Fill #0

## 2017-07-14 MED FILL — FLUTICASONE PROP 50 MCG SPR: 50 | 30 days supply | Qty: 16 | Fill #0

## 2017-07-14 NOTE — Progress Notes (Signed)
Thank you for the details you included in the comment boxes. Those details are very helpful in determining the best course of treatment for you and help [place] to provide the best care.  Thank you for the return phone call. Given the new information you shared about throat congestions starting on Wednesday, we will modify your treatment plan as below.  We are sorry that you are not feeling well.  Here is how we plan to help!  Based on your presentation I believe you most likely have A cough due to bacteria.  When patients have a fever and a productive cough with a change in color or increased sputum production, we are concerned about bacterial bronchitis.  If left untreated it can progress to pneumonia.  If your symptoms do not improve with your treatment plan it is important that you contact your provider.   I have prescribed Azithromyin 250 mg: two tablets now and then one tablet daily for 4 additonal days    In addition you may use A non-prescription cough medication called Mucinex DM: take 2 tablets every 12 hours. and A prescription cough medication called Tessalon Perles 100mg . You may take 1-2 capsules every 8 hours as needed for your cough.  Sterapred 5 mg dosepak Along with flonase 15mcg, take 2 sprays in each nare daily.   From your responses in the eVisit questionnaire you describe inflammation in the upper respiratory tract which is causing a significant cough.  This is commonly called Bronchitis and has four common causes:    Allergies  Viral Infections  Acid Reflux  Bacterial Infection Allergies, viruses and acid reflux are treated by controlling symptoms or eliminating the cause. An example might be a cough caused by taking certain blood pressure medications. You stop the cough by changing the medication. Another example might be a cough caused by acid reflux. Controlling the reflux helps control the cough.  USE OF BRONCHODILATOR ("RESCUE") INHALERS: There is a risk from using your  bronchodilator too frequently.  The risk is that over-reliance on a medication which only relaxes the muscles surrounding the breathing tubes can reduce the effectiveness of medications prescribed to reduce swelling and congestion of the tubes themselves.  Although you feel brief relief from the bronchodilator inhaler, your asthma may actually be worsening with the tubes becoming more swollen and filled with mucus.  This can delay other crucial treatments, such as oral steroid medications. If you need to use a bronchodilator inhaler daily, several times per day, you should discuss this with your provider.  There are probably better treatments that could be used to keep your asthma under control.     HOME CARE . Only take medications as instructed by your medical team. . Complete the entire course of an antibiotic. . Drink plenty of fluids and get plenty of rest. . Avoid close contacts especially the very young and the elderly . Cover your mouth if you cough or cough into your sleeve. . Always remember to wash your hands . A steam or ultrasonic humidifier can help congestion.   GET HELP RIGHT AWAY IF: . You develop worsening fever. . You become short of breath . You cough up blood. . Your symptoms persist after you have completed your treatment plan MAKE SURE YOU   Understand these instructions.  Will watch your condition.  Will get help right away if you are not doing well or get worse.  Your e-visit answers were reviewed by a board certified advanced clinical practitioner to  complete your personal care plan.  Depending on the condition, your plan could have included both over the counter or prescription medications. If there is a problem please reply  once you have received a response from your provider. Your safety is important to [place].  If you have drug allergies check your prescription carefully.    You can use MyChart to ask questions about today's visit, request a non-urgent call  back, or ask for a work or school excuse for 24 hours related to this e-Visit. If it has been greater than 24 hours you will need to follow up with your provider, or enter a new e-Visit to address those concerns. You will get an e-mail in the next two days asking about your experience.  I hope that your e-visit has been valuable and will speed your recovery. Thank you for using e-visits.

## 2017-07-21 MED FILL — LISINOPRIL-HCTZ 20-12.5 MG: 20-12.5 | 90 days supply | Qty: 180 | Fill #0

## 2017-07-25 MED FILL — METFORMIN HCL ER 500 MG TAB: 500 | 90 days supply | Qty: 360 | Fill #1

## 2017-07-30 ENCOUNTER — Other Ambulatory Visit (HOSPITAL_COMMUNITY)
Admission: RE | Admit: 2017-07-30 | Discharge: 2017-07-30 | Disposition: A | Discharge status: Home / Self Care | Payer: 59 | Source: Ambulatory Visit | Attending: Family Medicine | Admitting: Family Medicine

## 2017-07-30 ENCOUNTER — Other Ambulatory Visit: Payer: Self-pay | Admitting: Family Medicine

## 2017-07-30 DIAGNOSIS — N950 Postmenopausal bleeding: Secondary | ICD-10-CM | POA: Diagnosis not present

## 2017-07-30 DIAGNOSIS — M25572 Pain in left ankle and joints of left foot: Secondary | ICD-10-CM | POA: Diagnosis not present

## 2017-07-30 DIAGNOSIS — E119 Type 2 diabetes mellitus without complications: Secondary | ICD-10-CM | POA: Diagnosis not present

## 2017-07-30 DIAGNOSIS — M2142 Flat foot [pes planus] (acquired), left foot: Secondary | ICD-10-CM | POA: Diagnosis not present

## 2017-07-30 DIAGNOSIS — M2141 Flat foot [pes planus] (acquired), right foot: Secondary | ICD-10-CM | POA: Diagnosis not present

## 2017-07-30 DIAGNOSIS — I10 Essential (primary) hypertension: Secondary | ICD-10-CM | POA: Diagnosis not present

## 2017-07-30 DIAGNOSIS — Z0000 Encounter for general adult medical examination without abnormal findings: Secondary | ICD-10-CM | POA: Diagnosis not present

## 2017-07-30 DIAGNOSIS — Z124 Encounter for screening for malignant neoplasm of cervix: Secondary | ICD-10-CM | POA: Diagnosis not present

## 2017-07-30 MED FILL — ULTICARE ALCOHOL SWABS 70 %: 70 | 90 days supply | Qty: 100 | Fill #0

## 2017-07-31 ENCOUNTER — Other Ambulatory Visit: Payer: Self-pay | Admitting: Family Medicine

## 2017-07-31 DIAGNOSIS — N950 Postmenopausal bleeding: Secondary | ICD-10-CM

## 2017-07-31 LAB — CYTOLOGY - PAP
Diagnosis: NEGATIVE
HPV: NOT DETECTED

## 2017-08-07 MED FILL — ACCU-CHEK GUIDE TEST STRIP: 50 days supply | Qty: 100 | Fill #0

## 2017-08-07 MED FILL — ACCU-CHEK FASTCLIX LANCETS: 51 days supply | Qty: 102 | Fill #0

## 2017-08-20 ENCOUNTER — Other Ambulatory Visit: Payer: 59

## 2017-09-02 ENCOUNTER — Ambulatory Visit
Admission: RE | Admit: 2017-09-02 | Discharge: 2017-09-02 | Disposition: A | Discharge status: Home / Self Care | Payer: 59 | Source: Ambulatory Visit | Attending: Family Medicine | Admitting: Family Medicine

## 2017-09-02 DIAGNOSIS — H524 Presbyopia: Secondary | ICD-10-CM | POA: Diagnosis not present

## 2017-09-02 DIAGNOSIS — N950 Postmenopausal bleeding: Secondary | ICD-10-CM

## 2017-09-02 DIAGNOSIS — Z1231 Encounter for screening mammogram for malignant neoplasm of breast: Secondary | ICD-10-CM | POA: Diagnosis not present

## 2017-09-02 DIAGNOSIS — N939 Abnormal uterine and vaginal bleeding, unspecified: Secondary | ICD-10-CM | POA: Diagnosis not present

## 2017-09-05 DIAGNOSIS — R922 Inconclusive mammogram: Secondary | ICD-10-CM | POA: Diagnosis not present

## 2017-11-03 MED FILL — LISINOPRIL-HCTZ 20-12.5 MG: 20-12.5 | 90 days supply | Qty: 180 | Fill #1

## 2017-11-10 ENCOUNTER — Emergency Department (HOSPITAL_COMMUNITY): Payer: 59

## 2017-11-10 ENCOUNTER — Emergency Department (HOSPITAL_COMMUNITY)
Admission: EM | Admit: 2017-11-10 | Discharge: 2017-11-10 | Disposition: A | Discharge status: Home / Self Care | Payer: 59 | Attending: Emergency Medicine | Admitting: Emergency Medicine

## 2017-11-10 ENCOUNTER — Other Ambulatory Visit: Payer: Self-pay

## 2017-11-10 ENCOUNTER — Encounter (HOSPITAL_COMMUNITY): Payer: Self-pay | Admitting: Emergency Medicine

## 2017-11-10 DIAGNOSIS — M79631 Pain in right forearm: Secondary | ICD-10-CM | POA: Diagnosis not present

## 2017-11-10 DIAGNOSIS — M79601 Pain in right arm: Secondary | ICD-10-CM

## 2017-11-10 DIAGNOSIS — S4991XA Unspecified injury of right shoulder and upper arm, initial encounter: Secondary | ICD-10-CM | POA: Diagnosis not present

## 2017-11-10 DIAGNOSIS — I10 Essential (primary) hypertension: Secondary | ICD-10-CM | POA: Insufficient documentation

## 2017-11-10 DIAGNOSIS — Z7984 Long term (current) use of oral hypoglycemic drugs: Secondary | ICD-10-CM | POA: Insufficient documentation

## 2017-11-10 DIAGNOSIS — Z79899 Other long term (current) drug therapy: Secondary | ICD-10-CM | POA: Diagnosis not present

## 2017-11-10 DIAGNOSIS — E119 Type 2 diabetes mellitus without complications: Secondary | ICD-10-CM | POA: Diagnosis not present

## 2017-11-10 DIAGNOSIS — S59911A Unspecified injury of right forearm, initial encounter: Secondary | ICD-10-CM | POA: Diagnosis not present

## 2017-11-10 MED ORDER — OXYCODONE HCL 5 MG PO TABS
5.0000 mg | ORAL_TABLET | Freq: Once | ORAL | Status: DC
  Filled 2017-11-10: qty 1

## 2017-11-10 MED ORDER — BACITRACIN ZINC 500 UNIT/GM EX OINT
TOPICAL_OINTMENT | CUTANEOUS | Status: AC
  Filled 2017-11-10: qty 0.9

## 2017-11-10 MED ORDER — ACETAMINOPHEN 500 MG PO TABS
1000.0000 mg | ORAL_TABLET | Freq: Once | ORAL | Status: AC
  Administered 2017-11-10: 1000 mg via ORAL
  Filled 2017-11-10: qty 2

## 2017-11-10 MED ORDER — KETOROLAC TROMETHAMINE 60 MG/2ML IM SOLN
15.0000 mg | Freq: Once | INTRAMUSCULAR | Status: AC
  Administered 2017-11-10: 15 mg via INTRAMUSCULAR
  Filled 2017-11-10: qty 2
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
## 2017-11-10 NOTE — ED Notes (Signed)
EDP at bedside  

## 2017-11-10 NOTE — ED Notes (Signed)
Patient transported to X-ray 
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
## 2017-11-10 NOTE — Discharge Instructions (Signed)
Take 4 over the counter ibuprofen tablets 3 times a day or 2 over-the-counter naproxen tablets twice a day for pain. Also take tylenol 1000mg (2 extra strength) four times a day.    You have to take your arm out of the sling at least 4 times a day and move it around.

## 2017-12-02 DIAGNOSIS — M25511 Pain in right shoulder: Secondary | ICD-10-CM | POA: Diagnosis not present

## 2017-12-02 MED FILL — MELOXICAM 15 MG TABLET: 15 | 30 days supply | Qty: 30 | Fill #0

## 2017-12-10 MED FILL — METFORMIN HCL ER 500 MG TAB: 500 | 90 days supply | Qty: 360 | Fill #0

## 2017-12-19 DIAGNOSIS — M25511 Pain in right shoulder: Secondary | ICD-10-CM | POA: Diagnosis not present

## 2017-12-24 ENCOUNTER — Other Ambulatory Visit: Payer: Self-pay | Admitting: Orthopedic Surgery

## 2017-12-24 DIAGNOSIS — M67911 Unspecified disorder of synovium and tendon, right shoulder: Secondary | ICD-10-CM

## 2018-01-05 ENCOUNTER — Ambulatory Visit
Admission: RE | Admit: 2018-01-05 | Discharge: 2018-01-05 | Disposition: A | Discharge status: Home / Self Care | Payer: 59 | Source: Ambulatory Visit | Attending: Orthopedic Surgery | Admitting: Orthopedic Surgery

## 2018-01-05 DIAGNOSIS — M75121 Complete rotator cuff tear or rupture of right shoulder, not specified as traumatic: Secondary | ICD-10-CM | POA: Diagnosis not present

## 2018-01-05 DIAGNOSIS — M67911 Unspecified disorder of synovium and tendon, right shoulder: Secondary | ICD-10-CM

## 2018-01-05 MED ORDER — GADOBENATE DIMEGLUMINE 529 MG/ML IV SOLN
13.0000 mL | Freq: Once | INTRAVENOUS | Status: AC | PRN
  Administered 2018-01-05: 13 mL via INTRAVENOUS

## 2018-01-09 DIAGNOSIS — M75121 Complete rotator cuff tear or rupture of right shoulder, not specified as traumatic: Secondary | ICD-10-CM | POA: Diagnosis not present

## 2018-01-12 ENCOUNTER — Telehealth: Payer: 59 | Admitting: Family

## 2018-01-12 DIAGNOSIS — J028 Acute pharyngitis due to other specified organisms: Secondary | ICD-10-CM

## 2018-01-12 DIAGNOSIS — B9689 Other specified bacterial agents as the cause of diseases classified elsewhere: Secondary | ICD-10-CM

## 2018-01-12 MED ORDER — BENZONATATE 100 MG PO CAPS: 100.0000 mg | ORAL_CAPSULE | Freq: Three times a day (TID) | ORAL | 0 refills | Status: DC | PRN

## 2018-01-12 MED ORDER — AZITHROMYCIN 250 MG PO TABS: ORAL_TABLET | ORAL | 0 refills | Status: DC

## 2018-01-12 MED FILL — AZITHROMYCIN 250 MG TABLET: 250 | 5 days supply | Qty: 6 | Fill #0

## 2018-01-12 MED FILL — BENZONATATE 100 MG CAPS: 100 | 5 days supply | Qty: 30 | Fill #0

## 2018-01-12 NOTE — Progress Notes (Signed)

## 2018-01-20 DIAGNOSIS — M75121 Complete rotator cuff tear or rupture of right shoulder, not specified as traumatic: Secondary | ICD-10-CM | POA: Diagnosis not present

## 2018-01-20 DIAGNOSIS — G8918 Other acute postprocedural pain: Secondary | ICD-10-CM | POA: Diagnosis not present

## 2018-01-20 DIAGNOSIS — S46011S Strain of muscle(s) and tendon(s) of the rotator cuff of right shoulder, sequela: Secondary | ICD-10-CM | POA: Diagnosis not present

## 2018-01-20 DIAGNOSIS — M7541 Impingement syndrome of right shoulder: Secondary | ICD-10-CM | POA: Diagnosis not present

## 2018-01-20 DIAGNOSIS — S46011A Strain of muscle(s) and tendon(s) of the rotator cuff of right shoulder, initial encounter: Secondary | ICD-10-CM | POA: Diagnosis not present

## 2018-01-20 MED FILL — OXYCODONE-ACETAMINOPHEN 5-3: 5-325 | 3 days supply | Qty: 30 | Fill #0

## 2018-01-20 MED FILL — METHOCARBAMOL 500 MG TABS: 500 | 10 days supply | Qty: 30 | Fill #0

## 2018-01-27 DIAGNOSIS — I10 Essential (primary) hypertension: Secondary | ICD-10-CM | POA: Diagnosis not present

## 2018-01-27 DIAGNOSIS — E1169 Type 2 diabetes mellitus with other specified complication: Secondary | ICD-10-CM | POA: Diagnosis not present

## 2018-01-27 DIAGNOSIS — Z7984 Long term (current) use of oral hypoglycemic drugs: Secondary | ICD-10-CM | POA: Diagnosis not present

## 2018-01-27 MED FILL — LISINOPRIL-HCTZ 20-12.5 MG: 20-12.5 | 45 days supply | Qty: 90 | Fill #0

## 2018-02-27 DIAGNOSIS — Z9889 Other specified postprocedural states: Secondary | ICD-10-CM | POA: Diagnosis not present

## 2018-03-02 DIAGNOSIS — M75121 Complete rotator cuff tear or rupture of right shoulder, not specified as traumatic: Secondary | ICD-10-CM | POA: Diagnosis not present

## 2018-03-05 DIAGNOSIS — M75121 Complete rotator cuff tear or rupture of right shoulder, not specified as traumatic: Secondary | ICD-10-CM | POA: Diagnosis not present

## 2018-03-09 DIAGNOSIS — M75121 Complete rotator cuff tear or rupture of right shoulder, not specified as traumatic: Secondary | ICD-10-CM | POA: Diagnosis not present

## 2018-03-11 DIAGNOSIS — M75121 Complete rotator cuff tear or rupture of right shoulder, not specified as traumatic: Secondary | ICD-10-CM | POA: Diagnosis not present

## 2018-03-18 DIAGNOSIS — M75121 Complete rotator cuff tear or rupture of right shoulder, not specified as traumatic: Secondary | ICD-10-CM | POA: Diagnosis not present

## 2018-03-24 DIAGNOSIS — M75121 Complete rotator cuff tear or rupture of right shoulder, not specified as traumatic: Secondary | ICD-10-CM | POA: Diagnosis not present

## 2018-03-26 DIAGNOSIS — M75121 Complete rotator cuff tear or rupture of right shoulder, not specified as traumatic: Secondary | ICD-10-CM | POA: Diagnosis not present

## 2018-03-31 DIAGNOSIS — M75121 Complete rotator cuff tear or rupture of right shoulder, not specified as traumatic: Secondary | ICD-10-CM | POA: Diagnosis not present

## 2018-04-02 DIAGNOSIS — M75121 Complete rotator cuff tear or rupture of right shoulder, not specified as traumatic: Secondary | ICD-10-CM | POA: Diagnosis not present

## 2018-04-09 DIAGNOSIS — M75121 Complete rotator cuff tear or rupture of right shoulder, not specified as traumatic: Secondary | ICD-10-CM | POA: Diagnosis not present

## 2018-04-09 MED FILL — ACCU-CHEK GUIDE TEST STRIP: 50 days supply | Qty: 100 | Fill #1

## 2018-04-09 MED FILL — metFORMIN HCL ER 500 MG TB2: 500 | 90 days supply | Qty: 360 | Fill #1

## 2018-04-09 MED FILL — ULTICARE ALCOHOL SWABS PADS: 90 days supply | Qty: 100 | Fill #1

## 2018-04-09 MED FILL — ACCU-CHEK FASTCLIX LANCETS: 51 days supply | Qty: 102 | Fill #1

## 2018-04-09 MED FILL — LISINOPRIL-HCTZ 20-12.5 MG: 20-12.5 | 45 days supply | Qty: 90 | Fill #1

## 2018-04-10 DIAGNOSIS — Z9889 Other specified postprocedural states: Secondary | ICD-10-CM | POA: Diagnosis not present

## 2018-06-01 DIAGNOSIS — Z09 Encounter for follow-up examination after completed treatment for conditions other than malignant neoplasm: Secondary | ICD-10-CM | POA: Diagnosis not present

## 2018-06-04 MED FILL — LISINOPRIL-HCTZ 20-12.5 MG: 20-12.5 | 90 days supply | Qty: 180 | Fill #0

## 2018-08-03 MED FILL — metFORMIN HCL ER 500 MG TB2: 500 | 90 days supply | Qty: 360 | Fill #0 | Status: TO

## 2018-08-18 ENCOUNTER — Telehealth: Payer: 59 | Admitting: Physician Assistant

## 2018-08-18 DIAGNOSIS — J069 Acute upper respiratory infection, unspecified: Secondary | ICD-10-CM

## 2018-08-18 MED ORDER — BENZONATATE 100 MG PO CAPS: 100.0000 mg | ORAL_CAPSULE | Freq: Three times a day (TID) | ORAL | 0 refills | Status: DC

## 2018-08-18 MED ORDER — IPRATROPIUM BROMIDE 0.06 % NA SOLN: 2.0000 | Freq: Four times a day (QID) | NASAL | 12 refills | Status: DC

## 2018-08-18 NOTE — Progress Notes (Signed)
We are sorry you are not feeling well.  Here is how we plan to help!  Based on what you have shared with me, it looks like you may have a viral upper respiratory infection or a "common cold".  Colds are caused by a large number of viruses; however, rhinovirus is the most common cause.   Symptoms of the common cold vary from person to person, with common symptoms including sore throat, cough, and malaise.  A low-grade fever of 100.4 may present, but is often uncommon.  Symptoms vary however, and are closely related to a person's age or underlying illnesses.  The most common symptoms associated with the common cold are nasal discharge or congestion, cough, sneezing, headache and pressure in the ears and face.  Cold symptoms usually persist for about 3 to 10 days, but can last up to 2 weeks.  It is important to know that colds do not cause serious illness or complications in most cases.    The common cold is transmitted from person to person, with the most common method of transmission being a person's hands.  The virus is able to live on the skin and can infect other persons for up to 2 hours after direct contact.  Also, colds are transmitted when someone coughs or sneezes; thus, it is important to cover the mouth to reduce this risk.  To keep the spread of the common cold at [place], good hand hygiene is very important.  This is an infection that is most likely caused by a virus. There are no specific treatments for the common cold other than to help you with the symptoms until the infection runs its course.    For nasal congestion, you may use an oral decongestants such as Mucinex D or if you have glaucoma or high blood pressure use plain Mucinex.  Saline nasal spray or nasal drops can help and can safely be used as often as needed for congestion.  For your congestion, I have prescribed Ipratropium Bromide nasal spray 0.03% two sprays in each nostril 2-3 times a day  If you do not have a history of heart  disease, hypertension, diabetes or thyroid disease, prostate/bladder issues or glaucoma, you may also use Sudafed to treat nasal congestion.  It is highly recommended that you consult with a pharmacist or your primary care physician to ensure this medication is safe for you to take.     If you have a cough, you may use cough suppressants such as Delsym and Robitussin.  If you have glaucoma or high blood pressure, you can also use Coricidin HBP.   For cough I have prescribed for you A prescription cough medication called Tessalon Perles 100 mg. You may take 1-2 capsules every 8 hours as needed for cough  If you have a sore or scratchy throat, use a saltwater gargle-  to  teaspoon of salt dissolved in a 4-ounce to 8-ounce glass of warm water.  Gargle the solution for approximately 15-30 seconds and then spit.  It is important not to swallow the solution.  You can also use throat lozenges/cough drops and Chloraseptic spray to help with throat pain or discomfort.  Warm or cold liquids can also be helpful in relieving throat pain.  For headache, pain or general discomfort, you can use Ibuprofen or Tylenol as directed.   Some authorities believe that zinc sprays or the use of Echinacea may shorten the course of your symptoms.   HOME CARE Only take medications as instructed  by your medical team. Be sure to drink plenty of fluids. Water is fine as well as fruit juices, sodas and electrolyte beverages. You may want to stay away from caffeine or alcohol. If you are nauseated, try taking small sips of liquids. How do you know if you are getting enough fluid? Your urine should be a pale yellow or almost colorless. Get rest. Taking a steamy shower or using a humidifier may help nasal congestion and ease sore throat pain. You can place a towel over your head and breathe in the steam from hot water coming from a faucet. Using a saline nasal spray works much the same way. Cough drops, hard candies and sore throat  lozenges may ease your cough. Avoid close contacts especially the very young and the elderly Cover your mouth if you cough or sneeze Always remember to wash your hands.   GET HELP RIGHT AWAY IF: You develop worsening fever. If your symptoms do not improve within 10 days You develop yellow or green discharge from your nose over 3 days. You have coughing fits You develop a severe head ache or visual changes. You develop shortness of breath, difficulty breathing or start having chest pain  Your symptoms persist after you have completed your treatment plan  MAKE SURE YOU  Understand these instructions. Will watch your condition. Will get help right away if you are not doing well or get worse.  Your e-visit answers were reviewed by a board certified advanced clinical practitioner to complete your personal care plan. Depending upon the condition, your plan could have included both over the counter or prescription medications. Please review your pharmacy choice. If there is a problem, you may call our nursing hot line at and have the prescription routed to another pharmacy. Your safety is important to [place]. If you have drug allergies check your prescription carefully.   You can use MyChart to ask questions about today's visit, request a non-urgent call back, or ask for a work or school excuse for 24 hours related to this e-Visit. If it has been greater than 24 hours you will need to follow up with your provider, or enter a new e-Visit to address those concerns. You will get an e-mail in the next two days asking about your experience.  I hope that your e-visit has been valuable and will speed your recovery. Thank you for using e-visits.      ===View-only below this line===   ----- Message -----    From: [name]    Sent: 08/18/2018 11:58 AM EST      To: E-Visit Mailing List Subject: E-Visit Submission: Sore Throat  E-Visit Submission: Sore  Throat --------------------------------  Question: Do you have any of the following symptoms (please select all that apply)? Answer:   Runny or congested nose            Irritation or redness of the eyes            Cough or hoarseness            Pain in the throat when swallowing  Question: How long have you been having these symptoms? Answer:   3 days  Question: Do you have a fever? Answer:   No, I do not have a fever  Question: Are you in close contact with anyone who has similar symptoms ? Answer:   No  Question: Are you taking any over the counter medications for your symptoms? Answer:   No  Question: Please  list your medication allergies that you may have ? (If 'none' , please list as 'none') Answer:   None  Question: Please list any additional comments  Answer:     Question: Are you pregnant? Answer:   I am confident that I am not pregnant  Question: Are you breastfeeding? Answer:   No  A total of 5-10 minutes was spent evaluating this patients questionnaire and formulating a plan of care.

## 2018-09-08 DIAGNOSIS — Z1231 Encounter for screening mammogram for malignant neoplasm of breast: Secondary | ICD-10-CM | POA: Diagnosis not present

## 2018-09-11 DIAGNOSIS — Z0000 Encounter for general adult medical examination without abnormal findings: Secondary | ICD-10-CM | POA: Diagnosis not present

## 2018-09-11 DIAGNOSIS — E1169 Type 2 diabetes mellitus with other specified complication: Secondary | ICD-10-CM | POA: Diagnosis not present

## 2018-09-11 DIAGNOSIS — Z13220 Encounter for screening for lipoid disorders: Secondary | ICD-10-CM | POA: Diagnosis not present

## 2018-09-11 DIAGNOSIS — I10 Essential (primary) hypertension: Secondary | ICD-10-CM | POA: Diagnosis not present

## 2018-09-11 DIAGNOSIS — N950 Postmenopausal bleeding: Secondary | ICD-10-CM | POA: Diagnosis not present

## 2018-09-11 DIAGNOSIS — R001 Bradycardia, unspecified: Secondary | ICD-10-CM | POA: Diagnosis not present

## 2018-09-11 MED FILL — LISINOPRIL-HCTZ 20-12.5 MG: 20-12.5 | 90 days supply | Qty: 90 | Fill #0

## 2018-09-15 ENCOUNTER — Telehealth: Payer: Self-pay | Admitting: *Deleted

## 2018-09-15 MED FILL — ATORVASTATIN 20 MG TABLET: 20 | 30 days supply | Qty: 30 | Fill #0 | Status: TO

## 2018-09-15 NOTE — Telephone Encounter (Signed)
REFERRAL SENT TO SCHEDULING AND NOTES ON FILE FROM DR. CYNTHIA WHITE [phone]. °

## 2018-09-16 MED FILL — SM ALCOHOL 70% PREP PADS: 70 | 90 days supply | Qty: 100 | Fill #0

## 2018-09-30 ENCOUNTER — Telehealth: Payer: Self-pay

## 2018-09-30 NOTE — Telephone Encounter (Signed)
TELEPHONE CALL NOTE  Laura Gallegos has been deemed a candidate for a follow-up tele-health visit to limit community exposure during the Covid-19 pandemic. I spoke with the patient via phone to ensure availability of phone/video source, confirm preferred email & phone number, and discuss instructions and expectations.  I reminded Laura Gallegos to be prepared with any vital sign and/or heart rhythm information that could potentially be obtained via home monitoring, at the time of her visit. I reminded Laura Gallegos to expect a phone call at the time of her visit if her visit.  Did the patient verbally acknowledge consent to treatment? Patient gave verbal consent.   Laura Gallegos, [place] 09/30/2018 2:33 PM   DOWNLOADING THE [place], go to [employer] and type in WebEx in the search bar. Laura Gallegos [employer], the blue/green circle. The app is free but as with any other app downloads, their phone may require them to verify saved payment information or Apple password. The patient does NOT have to create an account.  - If Android, ask patient to go to [employer] and type in WebEx in the search bar. [employer], the blue/green circle. The app is free but as with any other app downloads, their phone may require them to verify saved payment information or Android password. The patient does NOT have to create an account.   CONSENT FOR TELE-HEALTH VISIT - PLEASE REVIEW  I hereby voluntarily request, consent and authorize [facility] and its employed or contracted physicians, physician assistants, nurse practitioners or other licensed health care professionals (the Practitioner), to provide me with telemedicine health care services (the "Services") as deemed necessary by the treating Practitioner. I acknowledge and consent to receive the Services by the Practitioner via telemedicine. I understand that the telemedicine visit  will involve communicating with the Practitioner through live audiovisual communication technology and the disclosure of certain medical information by electronic transmission. I acknowledge that I have been given the opportunity to request an in-person assessment or other available alternative prior to the telemedicine visit and am voluntarily participating in the telemedicine visit.  I understand that I have the right to withhold or withdraw my consent to the use of telemedicine in the course of my care at any time, without affecting my right to future care or treatment, and that the Practitioner or I may terminate the telemedicine visit at any time. I understand that I have the right to inspect all information obtained and/or recorded in the course of the telemedicine visit and may receive copies of available information for a reasonable fee.  I understand that some of the potential risks of receiving the Services via telemedicine include:  Marland Kitchen Delay or interruption in medical evaluation due to technological equipment failure or disruption; . Information transmitted may not be sufficient (e.g. poor resolution of images) to allow for appropriate medical decision making by the Practitioner; and/or  . In rare instances, security protocols could fail, causing a breach of personal health information.  Furthermore, I acknowledge that it is my responsibility to provide information about my medical history, conditions and care that is complete and accurate to the best of my ability. I acknowledge that Practitioner's advice, recommendations, and/or decision may be based on factors not within their control, such as incomplete or inaccurate data provided by me or distortions of diagnostic images or specimens that may result from electronic transmissions. I understand that the practice  of medicine is not an Chief Strategy Officer and that Practitioner makes no warranties or guarantees regarding treatment outcomes. I acknowledge  that I will receive a copy of this consent concurrently upon execution via email to the email address I last provided but may also request a printed copy by calling the office of [place].    I understand that my insurance will be billed for this visit.   I have read or had this consent read to me. . I understand the contents of this consent, which adequately explains the benefits and risks of the Services being provided via telemedicine.  . I have been provided ample opportunity to ask questions regarding this consent and the Services and have had my questions answered to my satisfaction. . I give my informed consent for the services to be provided through the use of telemedicine in my medical care  By participating in this telemedicine visit I agree to the above.

## 2018-10-05 NOTE — Progress Notes (Signed)
Virtual Visit via Video Note   This visit type was conducted due to national recommendations for restrictions regarding the COVID-19 Pandemic (e.g. social distancing) in an effort to limit this patient's exposure and mitigate transmission in our community.  Due to her co-morbid illnesses, this patient is at least at moderate risk for complications without adequate follow up.  This format is felt to be most appropriate for this patient at this time.  All issues noted in this document were discussed and addressed.  A limited physical exam was performed with this format.  Please refer to the patient's chart for her consent to telehealth for [facility].   Evaluation Performed:  Follow-up visit  Date:  10/06/2018   ID:  Laura Gallegos, DOB [birth year], MRN [id number]  Patient Location: Home  Provider Location: Home  PCP:  Harlan Stains, MD  Cardiologist:  Minus Breeding, MD  Electrophysiologist:  None   Chief Complaint:  Bradycardia  History of Present Illness:    Laura Gallegos is a 48 y.o. female who presents via audio/video conferencing for a telehealth visit today.    The patient has no past cardiac history.  She was noted recently to have bradycardia with heart rate in the 40s.  She had hypertension for years.  She has been treated with meds as listed.  Recently her blood pressures been well when she is had a dose of lisinopril reduced.  This has happened as she has lost weight.  As she was getting her blood pressure adjusted and was being seen by her primary provider she was noted to have heart rates in the 40s.  There was an EKG will have access to this at this point.  She says since being home her heart rates been better although she will occasionally see 40s.  It typically is in the 80s.  She does not have chest pressure, neck or arm discomfort.  She does not have any shortness of breath, PND or orthopnea.  She has not noticed palpitations, presyncope or syncope.  She does snore  but she does not think she has sleep apnea and she rests well.  She says her sugars have been well controlled as addressed below.  She was exercising routinely without any limitations.  She is never had any prior cardiac work-up.  The patient does not have symptoms concerning for COVID-19 infection (fever, chills, cough, or new shortness of breath).    Past Medical History:  Diagnosis Date  . Diabetes mellitus without complication ([place])   . Hypertension   . Obesity    Past Surgical History:  Procedure Laterality Date  . CESAREAN SECTION     For all childbirths  . HERNIA REPAIR     Umbilical hernia  . ROTATOR CUFF REPAIR    . TUBAL LIGATION       Current Meds  Medication Sig  . atorvastatin (LIPITOR) 20 MG tablet Take 1 tablet by mouth daily.  . fluticasone (FLONASE) 50 MCG/ACT nasal spray Place 2 sprays into both nostrils daily.  Marland Kitchen lisinopril-hydrochlorothiazide (PRINZIDE,ZESTORETIC) 20-12.5 MG per tablet Take 0.5 tablets by mouth daily.   . metFORMIN (GLUCOPHAGE) 500 MG tablet Take 1,000 mg by mouth 2 (two) times daily with a meal. Reported on 10/13/2015    Prior to Admission medications   Medication Sig Start Date End Date Taking? Authorizing Provider  atorvastatin (LIPITOR) 20 MG tablet Take 1 tablet by mouth daily. 09/15/18  Yes [provider]  fluticasone (FLONASE) 50 MCG/ACT nasal spray  Place 2 sprays into both nostrils daily. 07/14/17  Yes Withrow, Elyse Jarvis, FNP  lisinopril-hydrochlorothiazide (PRINZIDE,ZESTORETIC) 20-12.5 MG per tablet Take 0.5 tablets by mouth daily.    Yes [provider]  metFORMIN (GLUCOPHAGE) 500 MG tablet Take 1,000 mg by mouth 2 (two) times daily with a meal. Reported on 10/13/2015   Yes [provider]    Allergies:   Patient has no known allergies.   Social History   Tobacco Use  . Smoking status: Never Smoker  . Smokeless tobacco: Never Used  Substance Use Topics  . Alcohol use: No  . Drug use: No     Family Hx:  The patient's family history includes Diabetes in her mother and sister; HIV in her father; Hypertension in her father; Stroke in her mother.  ROS:   Please see the history of present illness.    As stated in the HPI and negative for all other systems.    Prior CV studies:   The following studies were reviewed today:  Labs  Labs/Other Tests and Data Reviewed:    EKG:  Fax is pending.   Recent Labs: No results found for requested labs within last 8760 hours.   Recent Lipid Panel No results found for: CHOL, TRIG, HDL, CHOLHDL, LDLCALC, LDLDIRECT  Wt Readings from Last 3 Encounters:  10/06/18 251 lb (113.9 kg)  11/10/17 280 lb (127 kg)  06/07/16 288 lb 9.6 oz (130.9 kg)     Objective:    Vital Signs:  BP 120/79   Pulse (!) 57   Ht 5\' 4"  (1.626 m)   Wt 251 lb (113.9 kg)   LMP 08/30/2014 (Approximate)   BMI 43.08 kg/m    Well nourished, well developed female in no acute distress.   ASSESSMENT & PLAN:    BRADYCARDIA:      I am going to review the EKG when this is available. However, she is not having any symptoms.   She is not on occasions it could be contributing to her low heart rate.  At this point no change in therapy is indicated.  We will have her come back in the office for a physical exam when the virus is lifted.  She will let me know if she ever has any symptoms related to the slow heart rhythm.  DYSLIPIDEMIA: Her LDL was 91.  No change in therapy.  DM: A1c is 6.3.  This is managed by Harlan Stains, MD   OBESITY: She is lost 30 to 40 pounds with diet and exercise and I applauded this and encouraged more of the same.  HTN: Her blood pressure is well controlled and actually falling.  She has had her lisinopril reduced appropriately.  Perhaps in the future with more weight loss should be overcome off of this medication.  COVID-19 Education: The signs and symptoms of COVID-19 were discussed with the patient and how to seek care for testing (follow up with PCP  or arrange E-visit).  She works for [employer] from home.  Her 48 yo is home and careful.  She has a mom at home with IPFThe importance of social distancing was discussed today.  Time:   Today, I have spent 25 minutes with the patient with telehealth technology discussing the above problems.     Medication Adjustments/Labs and Tests Ordered: Current medicines are reviewed at length with the patient today.  Concerns regarding medicines are outlined above.   Tests Ordered: No orders of the defined types were placed in this encounter.  Medication Changes: No orders of the defined types were placed in this encounter.   Disposition:  Follow up prn  Signed, Minus Breeding, MD  10/06/2018 1:10 PM    [facility]

## 2018-10-06 ENCOUNTER — Telehealth (INDEPENDENT_AMBULATORY_CARE_PROVIDER_SITE_OTHER): Payer: 59 | Admitting: Cardiology

## 2018-10-06 ENCOUNTER — Encounter: Payer: Self-pay | Admitting: Cardiology

## 2018-10-06 ENCOUNTER — Telehealth: Payer: Self-pay | Admitting: Cardiology

## 2018-10-06 VITALS — BP 120/79 | HR 57 | Ht 64.0 in | Wt 251.0 lb

## 2018-10-06 DIAGNOSIS — R001 Bradycardia, unspecified: Secondary | ICD-10-CM | POA: Diagnosis not present

## 2018-10-06 DIAGNOSIS — I10 Essential (primary) hypertension: Secondary | ICD-10-CM

## 2018-10-06 DIAGNOSIS — E785 Hyperlipidemia, unspecified: Secondary | ICD-10-CM

## 2018-10-06 NOTE — Patient Instructions (Signed)

## 2018-10-06 NOTE — Telephone Encounter (Signed)
Pt had E-visit with Dr Percival Spanish today

## 2018-10-06 NOTE — Telephone Encounter (Signed)
F/U Message           Patient missed a call from office would like a call back.

## 2018-10-08 ENCOUNTER — Ambulatory Visit: Payer: 59 | Admitting: Cardiology

## 2018-10-23 MED FILL — ATORVASTATIN 20 MG TABLET: 20 | 30 days supply | Qty: 30 | Fill #0

## 2018-10-23 MED FILL — metFORMIN HCL ER 500 MG TB2: 500 | 90 days supply | Qty: 360 | Fill #0

## 2018-10-29 MED FILL — ACCU-CHEK GUIDE TEST STRIP: 90 days supply | Qty: 200 | Fill #0

## 2018-11-09 DIAGNOSIS — E785 Hyperlipidemia, unspecified: Secondary | ICD-10-CM | POA: Diagnosis not present

## 2018-11-09 DIAGNOSIS — Z79899 Other long term (current) drug therapy: Secondary | ICD-10-CM | POA: Diagnosis not present

## 2018-12-01 DIAGNOSIS — M25571 Pain in right ankle and joints of right foot: Secondary | ICD-10-CM | POA: Diagnosis not present

## 2018-12-01 MED FILL — predniSONE 10 MG TABS: 10 | 6 days supply | Qty: 21 | Fill #0

## 2018-12-01 MED FILL — ATORVASTATIN 20 MG TABLET: 20 | 90 days supply | Qty: 90 | Fill #0

## 2018-12-04 MED FILL — LISINOPRIL 10 MG TABS: 10 | 90 days supply | Qty: 90 | Fill #0

## 2018-12-17 DIAGNOSIS — N898 Other specified noninflammatory disorders of vagina: Secondary | ICD-10-CM | POA: Diagnosis not present

## 2018-12-17 DIAGNOSIS — N950 Postmenopausal bleeding: Secondary | ICD-10-CM | POA: Diagnosis not present

## 2018-12-18 MED FILL — metroNIDAZOLE 500 MG TABS: 500 | 7 days supply | Qty: 14 | Fill #0

## 2019-01-05 DIAGNOSIS — N760 Acute vaginitis: Secondary | ICD-10-CM | POA: Diagnosis not present

## 2019-01-05 DIAGNOSIS — Z1321 Encounter for screening for nutritional disorder: Secondary | ICD-10-CM | POA: Diagnosis not present

## 2019-01-05 DIAGNOSIS — N950 Postmenopausal bleeding: Secondary | ICD-10-CM | POA: Diagnosis not present

## 2019-01-18 DIAGNOSIS — H524 Presbyopia: Secondary | ICD-10-CM | POA: Diagnosis not present

## 2019-03-02 MED FILL — LISINOPRIL 10 MG TABS: 10 | 90 days supply | Qty: 90 | Fill #1

## 2019-03-02 MED FILL — ATORVASTATIN 20 MG TABLET: 20 | 90 days supply | Qty: 90 | Fill #0

## 2019-03-02 MED FILL — metFORMIN HCL ER 500 MG TB2: 500 | 90 days supply | Qty: 360 | Fill #0

## 2019-03-15 DIAGNOSIS — E785 Hyperlipidemia, unspecified: Secondary | ICD-10-CM | POA: Diagnosis not present

## 2019-03-15 DIAGNOSIS — E1169 Type 2 diabetes mellitus with other specified complication: Secondary | ICD-10-CM | POA: Diagnosis not present

## 2019-03-15 DIAGNOSIS — I10 Essential (primary) hypertension: Secondary | ICD-10-CM | POA: Diagnosis not present

## 2019-05-24 MED FILL — SM ALCOHOL 70% PREP PADS: 70 | 90 days supply | Qty: 100 | Fill #0

## 2019-05-24 MED FILL — ACCU-CHEK GUIDE STRP: 90 days supply | Qty: 200 | Fill #0

## 2019-09-07 MED FILL — FREESTYLE LANCETS: 50 days supply | Qty: 100 | Fill #0

## 2019-09-07 MED FILL — FREESTYLE LITE TEST STRIP: 25 days supply | Qty: 50 | Fill #0

## 2019-09-07 MED FILL — FREESTYLE LITE METER: 30 days supply | Qty: 1 | Fill #0

## 2019-09-14 ENCOUNTER — Other Ambulatory Visit (HOSPITAL_COMMUNITY): Payer: Self-pay | Admitting: Family Medicine

## 2019-09-14 DIAGNOSIS — I10 Essential (primary) hypertension: Secondary | ICD-10-CM | POA: Diagnosis not present

## 2019-09-14 DIAGNOSIS — E559 Vitamin D deficiency, unspecified: Secondary | ICD-10-CM | POA: Diagnosis not present

## 2019-09-14 DIAGNOSIS — Z1211 Encounter for screening for malignant neoplasm of colon: Secondary | ICD-10-CM | POA: Diagnosis not present

## 2019-09-14 DIAGNOSIS — J309 Allergic rhinitis, unspecified: Secondary | ICD-10-CM | POA: Diagnosis not present

## 2019-09-14 DIAGNOSIS — E785 Hyperlipidemia, unspecified: Secondary | ICD-10-CM | POA: Diagnosis not present

## 2019-09-14 DIAGNOSIS — Z1231 Encounter for screening mammogram for malignant neoplasm of breast: Secondary | ICD-10-CM | POA: Diagnosis not present

## 2019-09-14 DIAGNOSIS — Z0000 Encounter for general adult medical examination without abnormal findings: Secondary | ICD-10-CM | POA: Diagnosis not present

## 2019-09-14 DIAGNOSIS — E1169 Type 2 diabetes mellitus with other specified complication: Secondary | ICD-10-CM | POA: Diagnosis not present

## 2019-09-14 DIAGNOSIS — M109 Gout, unspecified: Secondary | ICD-10-CM | POA: Diagnosis not present

## 2019-09-14 MED FILL — METFORMIN HCL ER 500 MG TB2: 500 | 90 days supply | Qty: 360 | Fill #0

## 2019-09-14 MED FILL — ATORVASTATIN 20 MG TABLET: 20 | 90 days supply | Qty: 90 | Fill #0

## 2019-09-14 MED FILL — LISINOPRIL 5 MG TABS: 5 | 90 days supply | Qty: 90 | Fill #0

## 2019-09-14 MED FILL — MOMETASONE FUROATE 50 MCG S: 50 | 90 days supply | Qty: 51 | Fill #0

## 2019-10-09 ENCOUNTER — Ambulatory Visit: Payer: 59 | Attending: Internal Medicine

## 2019-10-09 DIAGNOSIS — Z23 Encounter for immunization: Secondary | ICD-10-CM

## 2019-10-09 NOTE — Progress Notes (Signed)
   Covid-19 Vaccination Clinic  Name:  Laura Gallegos    MRN: [id number] DOB: 10-04-1970  10/09/2019  Ms. [name] was observed post Covid-19 immunization for 15 minutes without incident. She was provided with Vaccine Information Sheet and instruction to access the V-Safe system.   Ms. [name] was instructed to call 911 with any severe reactions post vaccine: Marland Kitchen Difficulty breathing  . Swelling of face and throat  . A fast heartbeat  . A bad rash all over body  . Dizziness and weakness   Immunizations Administered    Name Date Dose VIS Date Route   Pfizer COVID-19 Vaccine 10/09/2019 11:58 AM 0.3 mL 06/04/2019 Intramuscular   Manufacturer: [place]   Lot: [id number]   [place]: [id number]

## 2019-11-03 ENCOUNTER — Ambulatory Visit: Payer: 59 | Attending: Internal Medicine

## 2019-11-03 DIAGNOSIS — Z23 Encounter for immunization: Secondary | ICD-10-CM

## 2019-11-03 NOTE — Progress Notes (Signed)
   Covid-19 Vaccination Clinic  Name:  Laura Gallegos    MRN: [id number] DOB: [birth year]  11/03/2019  [name] was observed post Covid-19 immunization for 15 minutes without incident. She was provided with Vaccine Information Sheet and instruction to access the V-Safe system.   [name] was instructed to call 911 with any severe reactions post vaccine: Marland Kitchen Difficulty breathing  . Swelling of face and throat  . A fast heartbeat  . A bad rash all over body  . Dizziness and weakness   Immunizations Administered    Name Date Dose VIS Date Route   Pfizer COVID-19 Vaccine 11/03/2019  5:00 PM 0.3 mL 08/18/2018 Intramuscular   Manufacturer: [place]   Lot: [id number]   [place]: [id number]

## 2019-11-04 IMAGING — CR DG FOREARM 2V*R*
2 series · 2 of 2 positions shown · non-contrast
Comparison: None.

CLINICAL DATA: Initial evaluation for acute trauma, pain.

EXAM:
RIGHT FOREARM - 2 VIEW

[x forearm ap right]
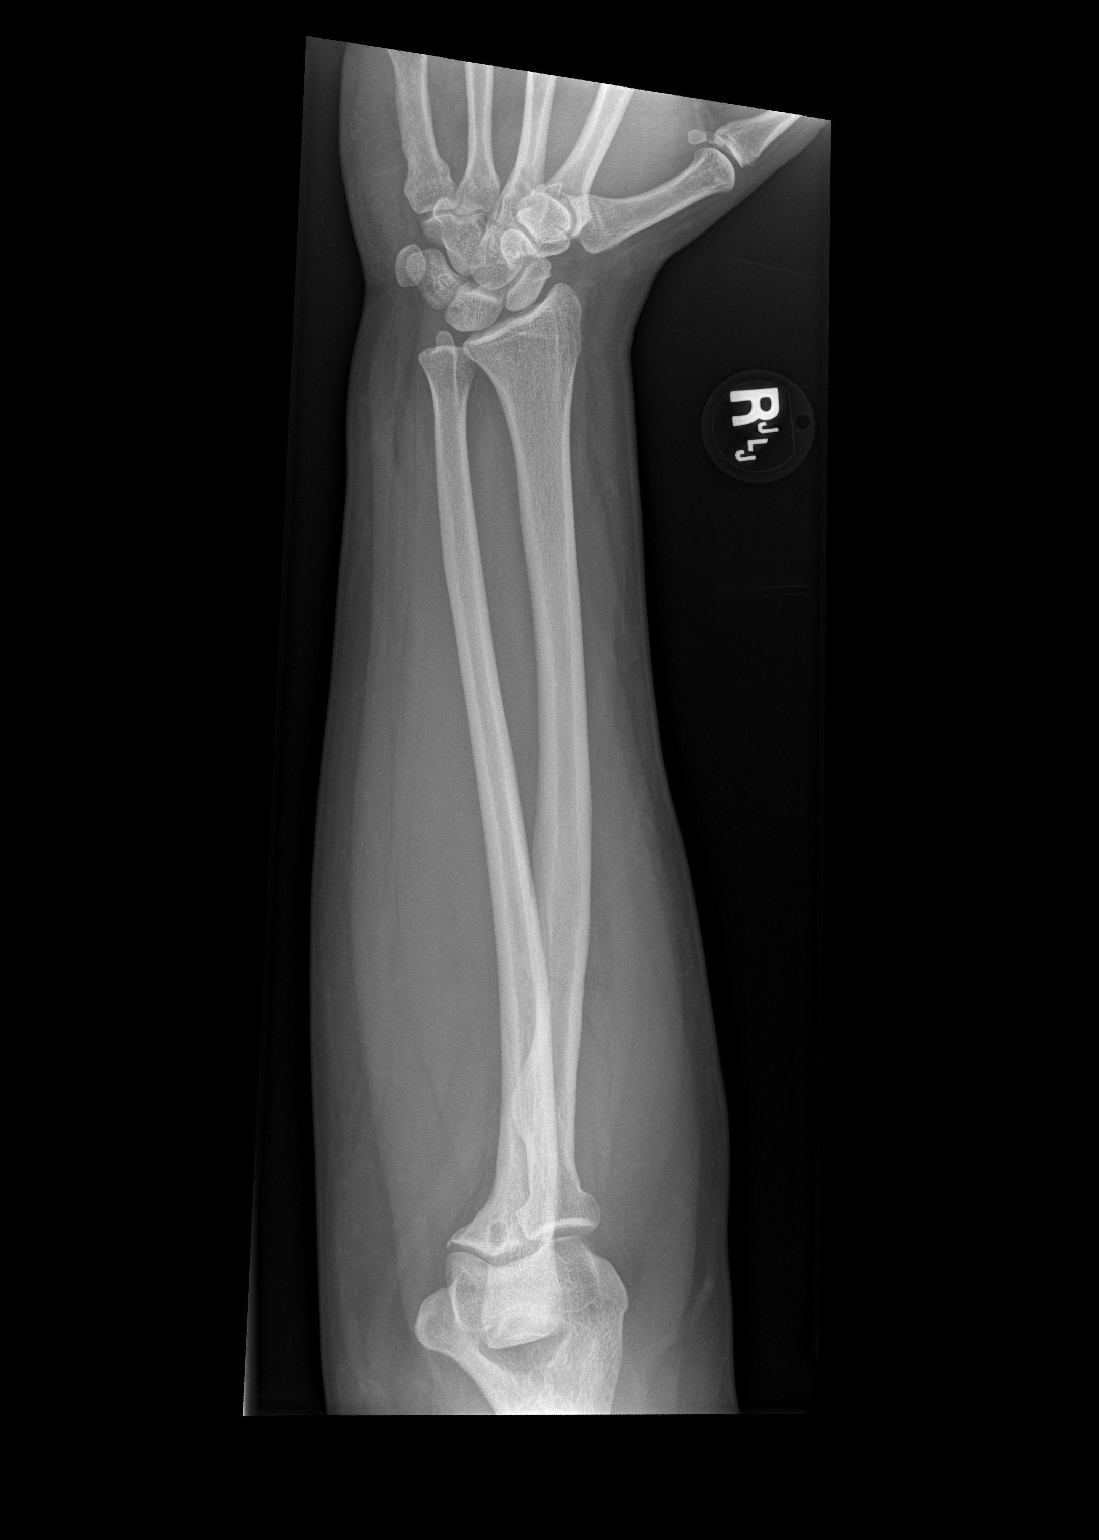

[x forearm lat right]
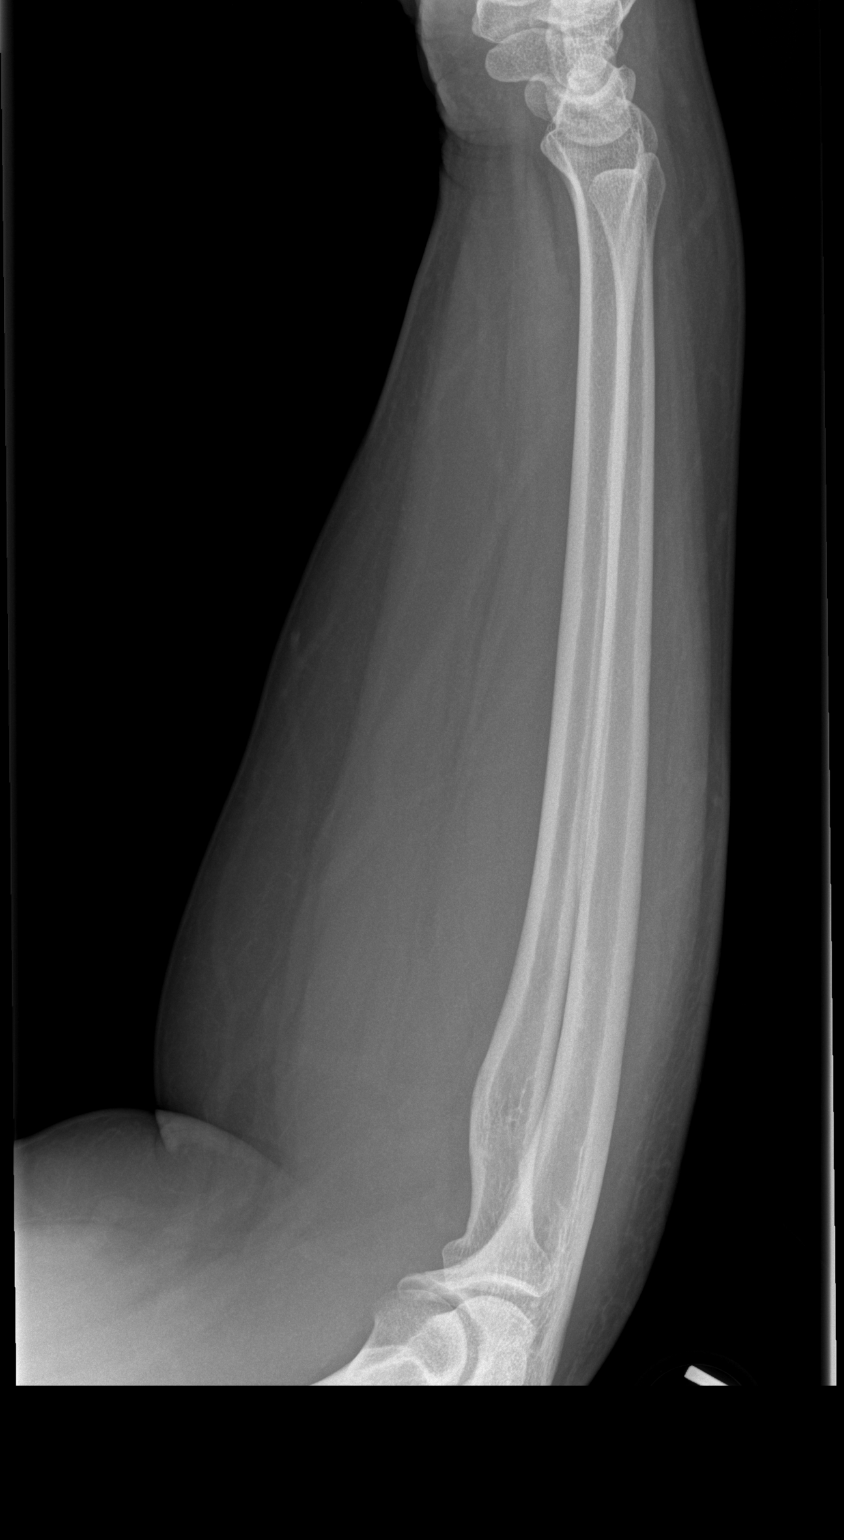

[2 of 2 positions shown; findings below may reference images not displayed]

FINDINGS: There is no evidence of fracture or other focal bone lesions.
Minimal spurring noted at the olecranon. Soft tissues are
unremarkable.
IMPRESSION: Negative.

## 2020-01-07 MED FILL — ATORVASTATIN 20 MG TABLET: 20 | 90 days supply | Qty: 90 | Fill #1

## 2020-01-07 MED FILL — LISINOPRIL 5 MG TABS: 5 | 90 days supply | Qty: 90 | Fill #1

## 2020-01-07 MED FILL — METFORMIN HCL ER 500 MG TB2: 500 | 90 days supply | Qty: 360 | Fill #1

## 2020-01-24 DIAGNOSIS — H524 Presbyopia: Secondary | ICD-10-CM | POA: Diagnosis not present

## 2020-01-24 DIAGNOSIS — Z7984 Long term (current) use of oral hypoglycemic drugs: Secondary | ICD-10-CM | POA: Diagnosis not present

## 2020-01-24 DIAGNOSIS — E1169 Type 2 diabetes mellitus with other specified complication: Secondary | ICD-10-CM | POA: Diagnosis not present

## 2020-02-24 MED FILL — OMRON 3 SERIES BP MONITOR D: 30 days supply | Qty: 1 | Fill #0

## 2020-03-16 ENCOUNTER — Other Ambulatory Visit (HOSPITAL_COMMUNITY): Payer: Self-pay | Admitting: Family Medicine

## 2020-03-16 DIAGNOSIS — M545 Low back pain: Secondary | ICD-10-CM | POA: Diagnosis not present

## 2020-03-16 DIAGNOSIS — I10 Essential (primary) hypertension: Secondary | ICD-10-CM | POA: Diagnosis not present

## 2020-03-16 DIAGNOSIS — E1169 Type 2 diabetes mellitus with other specified complication: Secondary | ICD-10-CM | POA: Diagnosis not present

## 2020-03-16 DIAGNOSIS — E785 Hyperlipidemia, unspecified: Secondary | ICD-10-CM | POA: Diagnosis not present

## 2020-05-01 MED FILL — ATORVASTATIN 20 MG TABLET: 20 | 90 days supply | Qty: 90 | Fill #2

## 2020-05-01 MED FILL — METFORMIN HCL ER 500 MG TB2: 500 | 90 days supply | Qty: 360 | Fill #0

## 2020-05-01 MED FILL — LISINOPRIL 5 MG TABS: 5 | 90 days supply | Qty: 90 | Fill #0

## 2020-06-14 DIAGNOSIS — J029 Acute pharyngitis, unspecified: Secondary | ICD-10-CM | POA: Diagnosis not present

## 2020-07-18 ENCOUNTER — Other Ambulatory Visit (HOSPITAL_COMMUNITY): Payer: Self-pay | Admitting: Otolaryngology

## 2020-07-18 DIAGNOSIS — J343 Hypertrophy of nasal turbinates: Secondary | ICD-10-CM | POA: Diagnosis not present

## 2020-07-18 DIAGNOSIS — Z8616 Personal history of COVID-19: Secondary | ICD-10-CM | POA: Diagnosis not present

## 2020-07-18 DIAGNOSIS — R070 Pain in throat: Secondary | ICD-10-CM | POA: Diagnosis not present

## 2020-07-18 DIAGNOSIS — K219 Gastro-esophageal reflux disease without esophagitis: Secondary | ICD-10-CM | POA: Diagnosis not present

## 2020-07-18 MED FILL — OMEPRAZOLE 40 MG CPDR: 40 | 30 days supply | Qty: 60 | Fill #0

## 2020-07-18 MED FILL — OMEPRAZOLE 40 MG CPDR: 40 | 30 days supply | Qty: 60 | Fill #0 | Status: TO

## 2020-08-07 MED FILL — METFORMIN HCL ER 500 MG TB2: 500 | 90 days supply | Qty: 360 | Fill #1

## 2020-08-07 MED FILL — LISINOPRIL 5 MG TABS: 5 | 90 days supply | Qty: 90 | Fill #1

## 2020-08-07 MED FILL — ATORVASTATIN CALCIUM 20 MG: 20 | 90 days supply | Qty: 90 | Fill #3

## 2020-08-15 MED FILL — ATORVASTATIN CALCIUM 20 MG: 20 | 90 days supply | Qty: 90 | Fill #3

## 2020-08-15 MED FILL — LISINOPRIL 5 MG TABS: 5 | 90 days supply | Qty: 90 | Fill #1

## 2020-09-18 ENCOUNTER — Other Ambulatory Visit (HOSPITAL_COMMUNITY): Payer: Self-pay | Admitting: Family Medicine

## 2020-09-18 DIAGNOSIS — E785 Hyperlipidemia, unspecified: Secondary | ICD-10-CM | POA: Diagnosis not present

## 2020-09-18 DIAGNOSIS — E1169 Type 2 diabetes mellitus with other specified complication: Secondary | ICD-10-CM | POA: Diagnosis not present

## 2020-09-18 DIAGNOSIS — Z0000 Encounter for general adult medical examination without abnormal findings: Secondary | ICD-10-CM | POA: Diagnosis not present

## 2020-09-18 DIAGNOSIS — I10 Essential (primary) hypertension: Secondary | ICD-10-CM | POA: Diagnosis not present

## 2020-09-18 DIAGNOSIS — E559 Vitamin D deficiency, unspecified: Secondary | ICD-10-CM | POA: Diagnosis not present

## 2020-09-18 DIAGNOSIS — M109 Gout, unspecified: Secondary | ICD-10-CM | POA: Diagnosis not present

## 2020-09-18 DIAGNOSIS — Z1211 Encounter for screening for malignant neoplasm of colon: Secondary | ICD-10-CM | POA: Diagnosis not present

## 2020-09-18 DIAGNOSIS — Z0184 Encounter for antibody response examination: Secondary | ICD-10-CM | POA: Diagnosis not present

## 2020-09-19 DIAGNOSIS — Z1231 Encounter for screening mammogram for malignant neoplasm of breast: Secondary | ICD-10-CM | POA: Diagnosis not present

## 2020-11-27 ENCOUNTER — Other Ambulatory Visit (HOSPITAL_COMMUNITY): Payer: Self-pay

## 2020-11-27 MED ORDER — LISINOPRIL 5 MG PO TABS
5.0000 mg | ORAL_TABLET | Freq: Every day | ORAL | 0 refills | Status: DC
  Filled 2020-11-27: qty 90, 90d supply, fill #0

## 2020-11-27 MED ORDER — METFORMIN HCL ER 500 MG PO TB24
1000.0000 mg | ORAL_TABLET | Freq: Two times a day (BID) | ORAL | 0 refills | Status: DC
  Filled 2020-11-27: qty 360, 90d supply, fill #0

## 2020-11-27 MED ORDER — ATORVASTATIN CALCIUM 20 MG PO TABS
20.0000 mg | ORAL_TABLET | Freq: Every evening | ORAL | 2 refills | Status: DC
  Filled 2020-11-27: qty 90, 90d supply, fill #0

## 2020-12-04 ENCOUNTER — Other Ambulatory Visit (HOSPITAL_COMMUNITY): Payer: Self-pay

## 2021-01-01 ENCOUNTER — Other Ambulatory Visit (HOSPITAL_COMMUNITY): Payer: Self-pay

## 2021-01-03 ENCOUNTER — Other Ambulatory Visit (HOSPITAL_COMMUNITY): Payer: Self-pay

## 2021-01-03 ENCOUNTER — Telehealth: Payer: 59 | Admitting: Physician Assistant

## 2021-01-03 DIAGNOSIS — J019 Acute sinusitis, unspecified: Secondary | ICD-10-CM

## 2021-01-03 DIAGNOSIS — B9689 Other specified bacterial agents as the cause of diseases classified elsewhere: Secondary | ICD-10-CM

## 2021-01-03 MED ORDER — AMOXICILLIN-POT CLAVULANATE 875-125 MG PO TABS
1.0000 | ORAL_TABLET | Freq: Two times a day (BID) | ORAL | 0 refills | Status: DC
  Filled 2021-01-03: qty 14, 7d supply, fill #0

## 2021-01-03 NOTE — Progress Notes (Signed)
I have spent 5 minutes in review of e-visit questionnaire, review and updating patient chart, medical decision making and response to patient.   Mickie Kozikowski Cody Aaria Happ, PA-C    

## 2021-01-03 NOTE — Progress Notes (Signed)

## 2021-03-17 ENCOUNTER — Other Ambulatory Visit (HOSPITAL_COMMUNITY): Payer: Self-pay

## 2021-03-19 ENCOUNTER — Other Ambulatory Visit (HOSPITAL_COMMUNITY): Payer: Self-pay

## 2021-03-19 MED ORDER — LISINOPRIL 5 MG PO TABS
5.0000 mg | ORAL_TABLET | Freq: Every day | ORAL | 1 refills | Status: DC
  Filled 2021-03-19 (×2): qty 90, 90d supply, fill #0
  Filled 2021-07-07: qty 90, 90d supply, fill #1

## 2021-03-19 MED ORDER — METFORMIN HCL ER 500 MG PO TB24
500.0000 mg | ORAL_TABLET | ORAL | 0 refills | Status: DC
  Filled 2021-03-19: qty 360, 90d supply, fill #0

## 2021-03-19 MED ORDER — LISINOPRIL 5 MG PO TABS
5.0000 mg | ORAL_TABLET | ORAL | 0 refills | Status: DC
  Filled 2021-03-19: qty 90, 90d supply, fill #0

## 2021-03-22 ENCOUNTER — Other Ambulatory Visit (HOSPITAL_COMMUNITY): Payer: Self-pay

## 2021-03-22 DIAGNOSIS — E1169 Type 2 diabetes mellitus with other specified complication: Secondary | ICD-10-CM | POA: Diagnosis not present

## 2021-03-22 DIAGNOSIS — Z23 Encounter for immunization: Secondary | ICD-10-CM | POA: Diagnosis not present

## 2021-03-22 DIAGNOSIS — E785 Hyperlipidemia, unspecified: Secondary | ICD-10-CM | POA: Diagnosis not present

## 2021-03-22 DIAGNOSIS — Z7984 Long term (current) use of oral hypoglycemic drugs: Secondary | ICD-10-CM | POA: Diagnosis not present

## 2021-03-22 DIAGNOSIS — N289 Disorder of kidney and ureter, unspecified: Secondary | ICD-10-CM | POA: Diagnosis not present

## 2021-03-22 DIAGNOSIS — I10 Essential (primary) hypertension: Secondary | ICD-10-CM | POA: Diagnosis not present

## 2021-03-22 MED ORDER — LISINOPRIL 5 MG PO TABS
5.0000 mg | ORAL_TABLET | Freq: Every day | ORAL | 1 refills | Status: DC
  Filled 2021-03-22: qty 90, 90d supply, fill #0

## 2021-03-22 MED ORDER — ATORVASTATIN CALCIUM 20 MG PO TABS
20.0000 mg | ORAL_TABLET | Freq: Every evening | ORAL | 3 refills | Status: DC
  Filled 2021-03-22: qty 90, 90d supply, fill #0
  Filled 2021-07-07: qty 90, 90d supply, fill #1
  Filled 2021-10-12: qty 90, 90d supply, fill #2
  Filled 2022-01-13: qty 90, 90d supply, fill #3

## 2021-03-22 MED ORDER — METFORMIN HCL ER 500 MG PO TB24
1000.0000 mg | ORAL_TABLET | Freq: Two times a day (BID) | ORAL | 1 refills | Status: DC
  Filled 2021-03-22 – 2022-01-13 (×2): qty 360, 90d supply, fill #0

## 2021-05-19 ENCOUNTER — Telehealth: Payer: 59 | Admitting: Emergency Medicine

## 2021-05-19 DIAGNOSIS — J069 Acute upper respiratory infection, unspecified: Secondary | ICD-10-CM | POA: Diagnosis not present

## 2021-05-19 MED ORDER — BENZONATATE 100 MG PO CAPS
100.0000 mg | ORAL_CAPSULE | Freq: Two times a day (BID) | ORAL | 0 refills | Status: DC | PRN
Start: 2021-05-19 — End: 2021-06-15

## 2021-05-19 MED ORDER — AZELASTINE HCL 0.1 % NA SOLN: 1.0000 | Freq: Two times a day (BID) | NASAL | 12 refills | Status: DC

## 2021-05-19 NOTE — Progress Notes (Signed)
E-Visit for Upper Respiratory Infection   We are sorry you are not feeling well.  Here is how we plan to help!  Based on what you have shared with me, it looks like you may have a viral upper respiratory infection.  Upper respiratory infections are caused by a large number of viruses; however, rhinovirus is the most common cause.   Symptoms vary from person to person, with common symptoms including sore throat, cough, fatigue or lack of energy and feeling of general discomfort.  A low-grade fever of up to 100.4 may present, but is often uncommon.  Symptoms vary however, and are closely related to a person's age or underlying illnesses.  The most common symptoms associated with an upper respiratory infection are nasal discharge or congestion, cough, sneezing, headache and pressure in the ears and face.  These symptoms usually persist for about 3 to 10 days, but can last up to 2 weeks.  It is important to know that upper respiratory infections do not cause serious illness or complications in most cases.    Upper respiratory infections can be transmitted from person to person, with the most common method of transmission being a person's hands.  The virus is able to live on the skin and can infect other persons for up to 2 hours after direct contact.  Also, these can be transmitted when someone coughs or sneezes; thus, it is important to cover the mouth to reduce this risk.  To keep the spread of the illness at bay, good hand hygiene is very important.  This is an infection that is most likely caused by a virus. There are no specific treatments other than to help you with the symptoms until the infection runs its course.  We are sorry you are not feeling well.  Here is how we plan to help!   For nasal congestion, you may use an oral decongestants such as Mucinex D or if you have glaucoma or high blood pressure use plain Mucinex.  Saline nasal spray or nasal drops can help and can safely be used as often as  needed for congestion.  For your congestion, I have prescribed Azelastine nasal spray two sprays in each nostril twice a day  If you do not have a history of heart disease, hypertension, diabetes or thyroid disease, prostate/bladder issues or glaucoma, you may also use Sudafed to treat nasal congestion.  It is highly recommended that you consult with a pharmacist or your primary care physician to ensure this medication is safe for you to take.     If you have a cough, you may use cough suppressants such as Delsym and Robitussin.  If you have glaucoma or high blood pressure, you can also use Coricidin HBP.   For cough I have prescribed for you A prescription cough medication called Tessalon Perles 100 mg. You may take 1-2 capsules every 8 hours as needed for cough  If you have a sore or scratchy throat, use a saltwater gargle-  to  teaspoon of salt dissolved in a 4-ounce to 8-ounce glass of warm water.  Gargle the solution for approximately 15-30 seconds and then spit.  It is important not to swallow the solution.  You can also use throat lozenges/cough drops and Chloraseptic spray to help with throat pain or discomfort.  Warm or cold liquids can also be helpful in relieving throat pain.  For headache, pain or general discomfort, you can use Ibuprofen or Tylenol as directed.   Some authorities believe   that zinc sprays or the use of Echinacea may shorten the course of your symptoms. ° ° °HOME CARE °Only take medications as instructed by your medical team. °Be sure to drink plenty of fluids. Water is fine as well as fruit juices, sodas and electrolyte beverages. You may want to stay away from caffeine or alcohol. If you are nauseated, try taking small sips of liquids. How do you know if you are getting enough fluid? Your urine should be a pale yellow or almost colorless. °Get rest. °Taking a steamy shower or using a humidifier may help nasal congestion and ease sore throat pain. You can place a towel over  your head and breathe in the steam from hot water coming from a faucet. °Using a saline nasal spray works much the same way. °Cough drops, hard candies and sore throat lozenges may ease your cough. °Avoid close contacts especially the very young and the elderly °Cover your mouth if you cough or sneeze °Always remember to wash your hands.  ° °GET HELP RIGHT AWAY IF: °You develop worsening fever. °If your symptoms do not improve within 10 days °You develop yellow or green discharge from your nose over 3 days. °You have coughing fits °You develop a severe head ache or visual changes. °You develop shortness of breath, difficulty breathing or start having chest pain °Your symptoms persist after you have completed your treatment plan ° °MAKE SURE YOU  °Understand these instructions. °Will watch your condition. °Will get help right away if you are not doing well or get worse. ° °Thank you for choosing an e-visit. ° °Your e-visit answers were reviewed by a board certified advanced clinical practitioner to complete your personal care plan. Depending upon the condition, your plan could have included both over the counter or prescription medications. ° °Please review your pharmacy choice. Make sure the pharmacy is open so you can pick up prescription now. If there is a problem, you may contact your provider through MyChart messaging and have the prescription routed to another pharmacy.  Your safety is important to us. If you have drug allergies check your prescription carefully.  ° °For the next 24 hours you can use MyChart to ask questions about today's visit, request a non-urgent call back, or ask for a work or school excuse. °You will get an email in the next two days asking about your experience. I hope that your e-visit has been valuable and will speed your recovery. ° ° ° ° °

## 2021-05-19 NOTE — Progress Notes (Signed)
I have spent 5 minutes in review of e-visit questionnaire, review and updating patient chart, medical decision making and response to patient.   Ethal Gotay, PA-C    

## 2021-06-15 ENCOUNTER — Other Ambulatory Visit (HOSPITAL_COMMUNITY): Payer: Self-pay

## 2021-06-15 ENCOUNTER — Telehealth: Payer: 59 | Admitting: Physician Assistant

## 2021-06-15 DIAGNOSIS — B9689 Other specified bacterial agents as the cause of diseases classified elsewhere: Secondary | ICD-10-CM | POA: Diagnosis not present

## 2021-06-15 DIAGNOSIS — J208 Acute bronchitis due to other specified organisms: Secondary | ICD-10-CM

## 2021-06-15 DIAGNOSIS — J019 Acute sinusitis, unspecified: Secondary | ICD-10-CM | POA: Diagnosis not present

## 2021-06-15 MED ORDER — BENZONATATE 100 MG PO CAPS
100.0000 mg | ORAL_CAPSULE | Freq: Three times a day (TID) | ORAL | 0 refills | Status: DC | PRN
  Filled 2021-06-15 – 2022-04-08 (×2): qty 30, 10d supply, fill #0

## 2021-06-15 MED ORDER — DOXYCYCLINE HYCLATE 100 MG PO TABS
100.0000 mg | ORAL_TABLET | Freq: Two times a day (BID) | ORAL | 0 refills | Status: DC
  Filled 2021-06-15: qty 14, 7d supply, fill #0

## 2021-06-15 NOTE — Progress Notes (Signed)

## 2021-07-04 ENCOUNTER — Other Ambulatory Visit (HOSPITAL_COMMUNITY): Payer: Self-pay

## 2021-07-04 MED ORDER — PEG 3350-KCL-NA BICARB-NACL 420 G PO SOLR
ORAL | 0 refills | Status: DC
  Filled 2021-07-04: qty 4000, 1d supply, fill #0

## 2021-07-06 DIAGNOSIS — K5730 Diverticulosis of large intestine without perforation or abscess without bleeding: Secondary | ICD-10-CM | POA: Diagnosis not present

## 2021-07-06 DIAGNOSIS — Z1211 Encounter for screening for malignant neoplasm of colon: Secondary | ICD-10-CM | POA: Diagnosis not present

## 2021-07-06 DIAGNOSIS — D122 Benign neoplasm of ascending colon: Secondary | ICD-10-CM | POA: Diagnosis not present

## 2021-07-06 DIAGNOSIS — K648 Other hemorrhoids: Secondary | ICD-10-CM | POA: Diagnosis not present

## 2021-07-07 ENCOUNTER — Other Ambulatory Visit (HOSPITAL_COMMUNITY): Payer: Self-pay

## 2021-07-09 ENCOUNTER — Other Ambulatory Visit (HOSPITAL_COMMUNITY): Payer: Self-pay

## 2021-07-10 ENCOUNTER — Other Ambulatory Visit (HOSPITAL_COMMUNITY): Payer: Self-pay

## 2021-07-10 MED ORDER — METFORMIN HCL ER 500 MG PO TB24
1000.0000 mg | ORAL_TABLET | Freq: Two times a day (BID) | ORAL | 0 refills | Status: DC
  Filled 2021-07-10: qty 360, 90d supply, fill #0

## 2021-07-17 ENCOUNTER — Other Ambulatory Visit (HOSPITAL_COMMUNITY): Payer: Self-pay

## 2021-07-30 ENCOUNTER — Other Ambulatory Visit (HOSPITAL_COMMUNITY): Payer: Self-pay

## 2021-07-30 ENCOUNTER — Telehealth: Payer: 59 | Admitting: Nurse Practitioner

## 2021-07-30 DIAGNOSIS — J069 Acute upper respiratory infection, unspecified: Secondary | ICD-10-CM

## 2021-07-30 MED ORDER — FLUTICASONE PROPIONATE 50 MCG/ACT NA SUSP
2.0000 | Freq: Every day | NASAL | 6 refills | Status: DC
  Filled 2021-07-30: qty 16, 30d supply, fill #0

## 2021-07-30 NOTE — Progress Notes (Signed)
E-Visit for Sinus Problems  We are sorry that you are not feeling well.  Here is how we plan to help!  Based on what you have shared with me it looks like you have sinusitis.  Sinusitis is inflammation and infection in the sinus cavities of the head.  Based on your presentation I believe you most likely have Acute Viral Sinusitis.This is an infection most likely caused by a virus. There is not specific treatment for viral sinusitis other than to help you with the symptoms until the infection runs its course.  You may use an oral decongestant such as Mucinex D or if you have glaucoma or high blood pressure use plain Mucinex. Saline nasal spray help and can safely be used as often as needed for congestion, I have prescribed: Fluticasone nasal spray two sprays in each nostril once a day  Some authorities believe that zinc sprays or the use of Echinacea may shorten the course of your symptoms.  Sinus infections are not as easily transmitted as other respiratory infection, however we still recommend that you avoid close contact with loved ones, especially the very young and elderly.  Remember to wash your hands thoroughly throughout the day as this is the number one way to prevent the spread of infection!  Home Care: Only take medications as instructed by your medical team. Do not take these medications with alcohol. A steam or ultrasonic humidifier can help congestion.  You can place a towel over your head and breathe in the steam from hot water coming from a faucet. Avoid close contacts especially the very young and the elderly. Cover your mouth when you cough or sneeze. Always remember to wash your hands.  Get Help Right Away If: You develop worsening fever or sinus pain. You develop a severe head ache or visual changes. Your symptoms persist after you have completed your treatment plan.  Make sure you Understand these instructions. Will watch your condition. Will get help right away if you  are not doing well or get worse.   Thank you for choosing an e-visit.  Your e-visit answers were reviewed by a board certified advanced clinical practitioner to complete your personal care plan. Depending upon the condition, your plan could have included both over the counter or prescription medications.  Please review your pharmacy choice. Make sure the pharmacy is open so you can pick up prescription now. If there is a problem, you may contact your provider through [employer] and have the prescription routed to another pharmacy.  Your safety is important to [place]. If you have drug allergies check your prescription carefully.   For the next 24 hours you can use MyChart to ask questions about today's visit, request a non-urgent call back, or ask for a work or school excuse. You will get an email in the next two days asking about your experience. I hope that your e-visit has been valuable and will speed your recovery.  I spent approximately 5 minutes reviewing the patient's history, current symptoms and coordinating their plan of care today.    Meds ordered this encounter  Medications   fluticasone (FLONASE) 50 MCG/ACT nasal spray    Sig: Place 2 sprays into both nostrils daily.    Dispense:  16 g    Refill:  6

## 2021-08-07 ENCOUNTER — Other Ambulatory Visit (HOSPITAL_COMMUNITY): Payer: Self-pay

## 2021-09-25 DIAGNOSIS — Z1231 Encounter for screening mammogram for malignant neoplasm of breast: Secondary | ICD-10-CM | POA: Diagnosis not present

## 2021-09-27 ENCOUNTER — Other Ambulatory Visit (HOSPITAL_COMMUNITY): Payer: Self-pay

## 2021-09-27 DIAGNOSIS — E1169 Type 2 diabetes mellitus with other specified complication: Secondary | ICD-10-CM | POA: Diagnosis not present

## 2021-09-27 DIAGNOSIS — E785 Hyperlipidemia, unspecified: Secondary | ICD-10-CM | POA: Diagnosis not present

## 2021-09-27 DIAGNOSIS — Z124 Encounter for screening for malignant neoplasm of cervix: Secondary | ICD-10-CM | POA: Diagnosis not present

## 2021-09-27 DIAGNOSIS — M109 Gout, unspecified: Secondary | ICD-10-CM | POA: Diagnosis not present

## 2021-09-27 DIAGNOSIS — Z23 Encounter for immunization: Secondary | ICD-10-CM | POA: Diagnosis not present

## 2021-09-27 DIAGNOSIS — E559 Vitamin D deficiency, unspecified: Secondary | ICD-10-CM | POA: Diagnosis not present

## 2021-09-27 DIAGNOSIS — I129 Hypertensive chronic kidney disease with stage 1 through stage 4 chronic kidney disease, or unspecified chronic kidney disease: Secondary | ICD-10-CM | POA: Diagnosis not present

## 2021-09-27 DIAGNOSIS — Z0000 Encounter for general adult medical examination without abnormal findings: Secondary | ICD-10-CM | POA: Diagnosis not present

## 2021-09-27 DIAGNOSIS — N183 Chronic kidney disease, stage 3 unspecified: Secondary | ICD-10-CM | POA: Diagnosis not present

## 2021-09-27 MED ORDER — ATORVASTATIN CALCIUM 20 MG PO TABS
20.0000 mg | ORAL_TABLET | Freq: Every evening | ORAL | 3 refills | Status: DC
Start: 2021-09-27 — End: 2022-05-08
  Filled 2021-09-27 – 2022-03-29 (×2): qty 90, 90d supply, fill #0

## 2021-09-27 MED ORDER — METFORMIN HCL ER 500 MG PO TB24
1000.0000 mg | ORAL_TABLET | Freq: Two times a day (BID) | ORAL | 1 refills | Status: DC
Start: 2021-09-27 — End: 2022-05-08
  Filled 2021-09-27 – 2021-11-05 (×2): qty 360, 90d supply, fill #0

## 2021-09-27 MED ORDER — LISINOPRIL 10 MG PO TABS
10.0000 mg | ORAL_TABLET | Freq: Every day | ORAL | 1 refills | Status: DC
  Filled 2021-09-27 – 2021-10-09 (×2): qty 90, 90d supply, fill #0
  Filled 2022-01-13: qty 90, 90d supply, fill #1

## 2021-10-05 ENCOUNTER — Other Ambulatory Visit (HOSPITAL_COMMUNITY): Payer: Self-pay

## 2021-10-06 ENCOUNTER — Other Ambulatory Visit (HOSPITAL_COMMUNITY): Payer: Self-pay

## 2021-10-09 ENCOUNTER — Other Ambulatory Visit (HOSPITAL_COMMUNITY): Payer: Self-pay

## 2021-10-12 ENCOUNTER — Other Ambulatory Visit (HOSPITAL_COMMUNITY): Payer: Self-pay

## 2021-10-15 ENCOUNTER — Other Ambulatory Visit (HOSPITAL_COMMUNITY): Payer: Self-pay

## 2021-10-16 ENCOUNTER — Other Ambulatory Visit (HOSPITAL_COMMUNITY): Payer: Self-pay

## 2021-11-05 ENCOUNTER — Other Ambulatory Visit (HOSPITAL_COMMUNITY): Payer: Self-pay

## 2022-01-14 ENCOUNTER — Other Ambulatory Visit (HOSPITAL_COMMUNITY): Payer: Self-pay

## 2022-01-15 ENCOUNTER — Other Ambulatory Visit (HOSPITAL_COMMUNITY): Payer: Self-pay

## 2022-01-16 ENCOUNTER — Other Ambulatory Visit (HOSPITAL_COMMUNITY): Payer: Self-pay

## 2022-03-29 ENCOUNTER — Other Ambulatory Visit (HOSPITAL_COMMUNITY): Payer: Self-pay

## 2022-03-29 ENCOUNTER — Telehealth: Payer: 59 | Admitting: Physician Assistant

## 2022-03-29 DIAGNOSIS — R21 Rash and other nonspecific skin eruption: Secondary | ICD-10-CM | POA: Diagnosis not present

## 2022-03-29 MED ORDER — PREDNISONE 10 MG (21) PO TBPK
ORAL_TABLET | ORAL | 0 refills | Status: DC
  Filled 2022-03-29: qty 21, 6d supply, fill #0

## 2022-03-29 NOTE — Progress Notes (Signed)
E Visit for Rash  We are sorry that you are not feeling well. Here is how we plan to help!  Based on what you shared with me it looks like you have contact dermatitis.  Contact dermatitis is a skin rash caused by something that touches the skin and causes irritation or inflammation.  Your skin may be red, swollen, dry, cracked, and itch.  The rash should go away in a few days but can last a few weeks.  If you get a rash, it's important to figure out what caused it so the irritant can be avoided in the future.  Prednisone 10 mg daily for 6 days (see taper instructions below)  Directions for 6 day taper: Day 1: 2 tablets before breakfast, 1 after both lunch & dinner and 2 at bedtime Day 2: 1 tab before breakfast, 1 after both lunch & dinner and 2 at bedtime Day 3: 1 tab at each meal & 1 at bedtime Day 4: 1 tab at breakfast, 1 at lunch, 1 at bedtime Day 5: 1 tab at breakfast & 1 tab at bedtime Day 6: 1 tab at breakfast  Monitor your blood sugar with starting a steroid as it can cause your sugar to run high. Be aware and limit carbohydrates and sugars even more in diet over next 6 days. If sugar goes above 400 stop the steroid and seek in person evaluation. Last labs had shown an A1c of 6.3, so I feel it is safe to start the steroid for you.    HOME CARE:  Take cool showers and avoid direct sunlight. Apply cool compress or wet dressings. Take a bath in an oatmeal bath.  Sprinkle content of one Aveeno packet under running faucet with comfortably warm water.  Bathe for 15-20 minutes, 1-2 times daily.  Pat dry with a towel. Do not rub the rash. Use hydrocortisone cream. Take an antihistamine like Benadryl for widespread rashes that itch.  The adult dose of Benadryl is 25-50 mg by mouth 4 times daily. Caution:  This type of medication may cause sleepiness.  Do not drink alcohol, drive, or operate dangerous machinery while taking antihistamines.  Do not take these medications if you have prostate  enlargement.  Read package instructions thoroughly on all medications that you take.  GET HELP RIGHT AWAY IF:  Symptoms don't go away after treatment. Severe itching that persists. If you rash spreads or swells. If you rash begins to smell. If it blisters and opens or develops a yellow-brown crust. You develop a fever. You have a sore throat. You become short of breath.  MAKE SURE YOU:  Understand these instructions. Will watch your condition. Will get help right away if you are not doing well or get worse.  Thank you for choosing an e-visit.  Your e-visit answers were reviewed by a board certified advanced clinical practitioner to complete your personal care plan. Depending upon the condition, your plan could have included both over the counter or prescription medications.  Please review your pharmacy choice. Make sure the pharmacy is open so you can pick up prescription now. If there is a problem, you may contact your provider through [employer] and have the prescription routed to another pharmacy.  Your safety is important to [place]. If you have drug allergies check your prescription carefully.   For the next 24 hours you can use MyChart to ask questions about today's visit, request a non-urgent call back, or ask for a work or school excuse. You  will get an email in the next two days asking about your experience. I hope that your e-visit has been valuable and will speed your recovery.  I have spent 5 minutes in review of e-visit questionnaire, review and updating patient chart, medical decision making and response to patient.   Mar Daring, PA-C

## 2022-04-01 ENCOUNTER — Other Ambulatory Visit (HOSPITAL_COMMUNITY): Payer: Self-pay

## 2022-04-01 DIAGNOSIS — I129 Hypertensive chronic kidney disease with stage 1 through stage 4 chronic kidney disease, or unspecified chronic kidney disease: Secondary | ICD-10-CM | POA: Diagnosis not present

## 2022-04-01 DIAGNOSIS — R21 Rash and other nonspecific skin eruption: Secondary | ICD-10-CM | POA: Diagnosis not present

## 2022-04-01 DIAGNOSIS — E785 Hyperlipidemia, unspecified: Secondary | ICD-10-CM | POA: Diagnosis not present

## 2022-04-01 DIAGNOSIS — N183 Chronic kidney disease, stage 3 unspecified: Secondary | ICD-10-CM | POA: Diagnosis not present

## 2022-04-01 DIAGNOSIS — E1169 Type 2 diabetes mellitus with other specified complication: Secondary | ICD-10-CM | POA: Diagnosis not present

## 2022-04-01 DIAGNOSIS — R001 Bradycardia, unspecified: Secondary | ICD-10-CM | POA: Diagnosis not present

## 2022-04-01 MED ORDER — METFORMIN HCL ER 500 MG PO TB24
1000.0000 mg | ORAL_TABLET | Freq: Two times a day (BID) | ORAL | 1 refills | Status: DC
  Filled 2022-04-01: qty 360, 90d supply, fill #0

## 2022-04-01 MED ORDER — LISINOPRIL 20 MG PO TABS
20.0000 mg | ORAL_TABLET | Freq: Every day | ORAL | 1 refills | Status: DC
  Filled 2022-04-01: qty 90, 90d supply, fill #0
  Filled 2022-07-19: qty 90, 90d supply, fill #1

## 2022-04-08 ENCOUNTER — Other Ambulatory Visit (HOSPITAL_COMMUNITY): Payer: Self-pay

## 2022-05-06 NOTE — Progress Notes (Unsigned)
Cardiology Office Note   Date:  05/08/2022   ID:  Laura Gallegos, DOB 1970/10/31, MRN [id number]  PCP:  Harlan Stains, MD  Cardiologist:   Minus Breeding, MD Referring:  Harlan Stains, MD  Chief Complaint  Patient presents with   Bradycardia      History of Present Illness: Laura Gallegos is a 51 y.o. female who presents for evaluation of bradycardia.  She was referred by Dr. Dema Severin.  I saw her back in 2020 remotely for this.  She is never had any symptoms related to this.  She was referred back because again her blood pressure is noted to be in the 40s.  However, she has no symptoms with this. The patient denies any new symptoms such as chest discomfort, neck or arm discomfort. There has been no new shortness of breath, PND or orthopnea. There have been no reported palpitations, presyncope or syncope.  She does her activities of daily living.  She helps care for the home.  She has a sedentary job.       Past Medical History:  Diagnosis Date   Diabetes mellitus without complication ([place])    Hypertension    Obesity     Past Surgical History:  Procedure Laterality Date   CESAREAN SECTION     For all childbirths   HERNIA REPAIR     Umbilical hernia   ROTATOR CUFF REPAIR     TUBAL LIGATION       Current Outpatient Medications  Medication Sig Dispense Refill   atorvastatin (LIPITOR) 20 MG tablet Take 1 tablet (20 mg total) by mouth every evening. 90 tablet 2   lisinopril (ZESTRIL) 20 MG tablet Take 1 tablet (20 mg total) by mouth daily. 90 tablet 1   metFORMIN (GLUCOPHAGE-XR) 500 MG 24 hr tablet Take 2 tablets (1,000 mg total) by mouth 2 (two) times daily with meals 360 tablet 1   mometasone (NASONEX) 50 MCG/ACT nasal spray USE 2 SPRAYS IN EACH NOSTRIL DAILY AS NEEDED (Patient not taking: Reported on 05/08/2022) 51 g 3   No current facility-administered medications for this visit.    Allergies:   Patient has no known allergies.    Social History:  The patient   reports that she has never smoked. She has never used smokeless tobacco. She reports that she does not drink alcohol and does not use drugs.   Family History:  The patient's family history includes Diabetes in her mother and sister; HIV in her father; Hypertension in her father; Stroke in her mother.    ROS:  Please see the history of present illness.   Otherwise, review of systems are positive for none.   All other systems are reviewed and negative.    PHYSICAL EXAM: VS:  BP 132/84 (BP Location: Left Arm, Patient Position: Sitting, Cuff Size: Large)   Pulse (!) 48   Ht '5\' 4"'$  (1.626 m)   Wt 252 lb 3.2 oz (114.4 kg)   LMP 08/30/2014 (Approximate)   SpO2 95%   BMI 43.29 kg/m  , BMI Body mass index is 43.29 kg/m. GENERAL:  Well appearing HEENT:  Pupils equal round and reactive, fundi not visualized, oral mucosa unremarkable NECK:  No jugular venous distention, waveform within normal limits, carotid upstroke brisk and symmetric, no bruits, no thyromegaly LYMPHATICS:  No cervical, inguinal adenopathy LUNGS:  Clear to auscultation bilaterally BACK:  No CVA tenderness CHEST:  Unremarkable HEART:  PMI not displaced or sustained,S1 and S2 within normal limits, no  S3, no S4, no clicks, no rubs, no murmurs ABD:  Flat, positive bowel sounds normal in frequency in pitch, no bruits, no rebound, no guarding, no midline pulsatile mass, no hepatomegaly, no splenomegaly EXT:  2 plus pulses throughout, no edema, no cyanosis no clubbing SKIN:  No rashes no nodules NEURO:  Cranial nerves II through XII grossly intact, motor grossly intact throughout PSYCH:  Cognitively intact, oriented to person place and time    EKG:  EKG is ordered today. The ekg ordered today demonstrates sinus bradycardia, rate 48, axis within normal limits, intervals within normal limits, nonspecific anterior T wave flattening   Recent Labs: No results found for requested labs within last 365 days.    Lipid Panel No  results found for: "CHOL", "TRIG", "HDL", "CHOLHDL", "VLDL", "LDLCALC", "LDLDIRECT"    Wt Readings from Last 3 Encounters:  05/08/22 252 lb 3.2 oz (114.4 kg)  10/06/18 251 lb (113.9 kg)  11/10/17 280 lb (127 kg)      Other studies Reviewed: Additional studies/ records that were reviewed today include: Wearable device. Review of the above records demonstrates:  Please see elsewhere in the note.     ASSESSMENT AND PLAN:  Bradycardia: She has bradycardia but no symptoms related to this.  I did review her heart rate readings on her wearable which she wears most of the time.  She actually did not realize this was on her phone.  However, it demonstrates what I would think is chronotropic competence.  Her heart rate goes up above 100 with activities.  It seems to be low when she is resting.  She has no presyncope or syncope.  No further work-up is indicated.  DM: A1c is 6.9.  She has lost quite a few pounds slowly over time with modification of diet we talked about this.  HTN: Blood pressures controlled.  No change in therapy.  In fact she had to have her blood pressure medicines reduced previously because of low blood pressures.  Current medicines are reviewed at length with the patient today.  The patient does not have concerns regarding medicines.  The following changes have been made:  no change  Labs/ tests ordered today include: None  Orders Placed This Encounter  Procedures   EKG 12-Lead     Disposition:   FU with me as needed   Signed, Minus Breeding, MD  05/08/2022 12:43 PM    [place]

## 2022-05-08 ENCOUNTER — Ambulatory Visit: Payer: 59 | Attending: Cardiology | Admitting: Cardiology

## 2022-05-08 ENCOUNTER — Encounter: Payer: Self-pay | Admitting: Cardiology

## 2022-05-08 VITALS — BP 132/84 | HR 48 | Ht 64.0 in | Wt 252.2 lb

## 2022-05-08 DIAGNOSIS — R001 Bradycardia, unspecified: Secondary | ICD-10-CM

## 2022-05-08 DIAGNOSIS — I10 Essential (primary) hypertension: Secondary | ICD-10-CM

## 2022-05-08 DIAGNOSIS — E118 Type 2 diabetes mellitus with unspecified complications: Secondary | ICD-10-CM | POA: Diagnosis not present

## 2022-05-08 NOTE — Patient Instructions (Signed)
Medication Instructions:  No changes  *If you need a refill on your cardiac medications before your next appointment, please call your pharmacy*   Lab Work: Not needed    Testing/Procedures:  Not needed  Follow-Up: At [facility], you and your health needs are our priority.  As part of our continuing mission to provide you with exceptional heart care, we have created designated Provider Care Teams.  These Care Teams include your primary Cardiologist (physician) and Advanced Practice Providers (APPs -  Physician Assistants and Nurse Practitioners) who all work together to provide you with the care you need, when you need it.     Your next appointment:   As needed  The format for your next appointment:   In Person  Provider:   Minus Breeding, MD

## 2022-05-10 DIAGNOSIS — H524 Presbyopia: Secondary | ICD-10-CM | POA: Diagnosis not present

## 2022-05-10 DIAGNOSIS — E119 Type 2 diabetes mellitus without complications: Secondary | ICD-10-CM | POA: Diagnosis not present

## 2022-07-19 ENCOUNTER — Other Ambulatory Visit (HOSPITAL_COMMUNITY): Payer: Self-pay

## 2022-07-19 ENCOUNTER — Other Ambulatory Visit: Payer: Self-pay

## 2022-07-22 ENCOUNTER — Other Ambulatory Visit (HOSPITAL_COMMUNITY): Payer: Self-pay

## 2022-07-22 MED ORDER — METFORMIN HCL ER 500 MG PO TB24
ORAL_TABLET | ORAL | 0 refills | Status: DC
  Filled 2022-07-22: qty 360, 90d supply, fill #0

## 2022-07-22 MED ORDER — ATORVASTATIN CALCIUM 20 MG PO TABS
20.0000 mg | ORAL_TABLET | Freq: Every evening | ORAL | 0 refills | Status: DC
  Filled 2022-07-22: qty 90, 90d supply, fill #0

## 2022-07-24 ENCOUNTER — Other Ambulatory Visit: Payer: Self-pay

## 2022-08-11 ENCOUNTER — Telehealth: Payer: Commercial Managed Care - PPO | Admitting: Family

## 2022-08-11 DIAGNOSIS — J069 Acute upper respiratory infection, unspecified: Secondary | ICD-10-CM

## 2022-08-11 MED ORDER — BENZONATATE 100 MG PO CAPS: 100.0000 mg | ORAL_CAPSULE | Freq: Three times a day (TID) | ORAL | 0 refills | Status: AC | PRN

## 2022-08-11 MED ORDER — FLUTICASONE PROPIONATE 50 MCG/ACT NA SUSP: 2.0000 | Freq: Every day | NASAL | 6 refills | Status: AC

## 2022-08-11 NOTE — Progress Notes (Signed)

## 2022-10-01 DIAGNOSIS — Z1231 Encounter for screening mammogram for malignant neoplasm of breast: Secondary | ICD-10-CM | POA: Diagnosis not present

## 2022-10-16 ENCOUNTER — Other Ambulatory Visit (HOSPITAL_BASED_OUTPATIENT_CLINIC_OR_DEPARTMENT_OTHER): Payer: Self-pay | Admitting: Family Medicine

## 2022-10-16 ENCOUNTER — Other Ambulatory Visit (HOSPITAL_COMMUNITY): Payer: Self-pay

## 2022-10-16 DIAGNOSIS — E1169 Type 2 diabetes mellitus with other specified complication: Secondary | ICD-10-CM | POA: Diagnosis not present

## 2022-10-16 DIAGNOSIS — R0789 Other chest pain: Secondary | ICD-10-CM

## 2022-10-16 DIAGNOSIS — N183 Chronic kidney disease, stage 3 unspecified: Secondary | ICD-10-CM | POA: Diagnosis not present

## 2022-10-16 DIAGNOSIS — R87610 Atypical squamous cells of undetermined significance on cytologic smear of cervix (ASC-US): Secondary | ICD-10-CM | POA: Diagnosis not present

## 2022-10-16 DIAGNOSIS — E785 Hyperlipidemia, unspecified: Secondary | ICD-10-CM | POA: Diagnosis not present

## 2022-10-16 DIAGNOSIS — I129 Hypertensive chronic kidney disease with stage 1 through stage 4 chronic kidney disease, or unspecified chronic kidney disease: Secondary | ICD-10-CM | POA: Diagnosis not present

## 2022-10-16 DIAGNOSIS — Z0000 Encounter for general adult medical examination without abnormal findings: Secondary | ICD-10-CM | POA: Diagnosis not present

## 2022-10-16 DIAGNOSIS — R87618 Other abnormal cytological findings on specimens from cervix uteri: Secondary | ICD-10-CM | POA: Diagnosis not present

## 2022-10-16 DIAGNOSIS — E559 Vitamin D deficiency, unspecified: Secondary | ICD-10-CM | POA: Diagnosis not present

## 2022-10-16 MED ORDER — LISINOPRIL-HYDROCHLOROTHIAZIDE 20-12.5 MG PO TABS
1.0000 | ORAL_TABLET | Freq: Every day | ORAL | 0 refills | Status: DC
  Filled 2022-10-16: qty 30, 30d supply, fill #0

## 2022-10-16 MED ORDER — METFORMIN HCL ER 500 MG PO TB24
1000.0000 mg | ORAL_TABLET | Freq: Two times a day (BID) | ORAL | 1 refills | Status: DC
  Filled 2022-10-16: qty 360, 90d supply, fill #0
  Filled 2023-01-15: qty 360, 90d supply, fill #1

## 2022-10-16 MED ORDER — ATORVASTATIN CALCIUM 20 MG PO TABS
20.0000 mg | ORAL_TABLET | Freq: Every evening | ORAL | 3 refills | Status: AC
  Filled 2022-10-16: qty 90, 90d supply, fill #0
  Filled 2023-01-15: qty 90, 90d supply, fill #1
  Filled 2023-04-19: qty 90, 90d supply, fill #2
  Filled 2023-08-17: qty 90, 90d supply, fill #3

## 2022-11-06 ENCOUNTER — Ambulatory Visit (HOSPITAL_COMMUNITY)
Admission: RE | Admit: 2022-11-06 | Discharge: 2022-11-06 | Disposition: A | Discharge status: Home / Self Care | Payer: Self-pay | Source: Ambulatory Visit | Attending: Family Medicine | Admitting: Family Medicine

## 2022-11-06 DIAGNOSIS — R0789 Other chest pain: Secondary | ICD-10-CM | POA: Insufficient documentation

## 2022-11-16 ENCOUNTER — Other Ambulatory Visit (HOSPITAL_COMMUNITY): Payer: Self-pay

## 2022-11-19 ENCOUNTER — Other Ambulatory Visit (HOSPITAL_COMMUNITY): Payer: Self-pay

## 2022-11-19 MED ORDER — LISINOPRIL-HYDROCHLOROTHIAZIDE 20-12.5 MG PO TABS
1.0000 | ORAL_TABLET | Freq: Every day | ORAL | 0 refills | Status: DC
  Filled 2022-11-19: qty 30, 30d supply, fill #0

## 2022-12-21 ENCOUNTER — Other Ambulatory Visit (HOSPITAL_COMMUNITY): Payer: Self-pay

## 2022-12-24 ENCOUNTER — Other Ambulatory Visit (HOSPITAL_COMMUNITY): Payer: Self-pay

## 2022-12-24 MED ORDER — LISINOPRIL-HYDROCHLOROTHIAZIDE 20-12.5 MG PO TABS
1.0000 | ORAL_TABLET | Freq: Every day | ORAL | 0 refills | Status: DC
  Filled 2022-12-24: qty 30, 30d supply, fill #0

## 2023-01-22 ENCOUNTER — Other Ambulatory Visit (HOSPITAL_COMMUNITY): Payer: Self-pay

## 2023-01-22 MED ORDER — LISINOPRIL-HYDROCHLOROTHIAZIDE 20-12.5 MG PO TABS
1.0000 | ORAL_TABLET | Freq: Every day | ORAL | 0 refills | Status: DC
  Filled 2023-02-08: qty 90, 90d supply, fill #0

## 2023-02-10 ENCOUNTER — Other Ambulatory Visit (HOSPITAL_COMMUNITY): Payer: Self-pay

## 2023-04-17 ENCOUNTER — Other Ambulatory Visit (HOSPITAL_COMMUNITY): Payer: Self-pay

## 2023-04-17 MED ORDER — METFORMIN HCL ER 500 MG PO TB24
1000.0000 mg | ORAL_TABLET | Freq: Two times a day (BID) | ORAL | 0 refills | Status: DC
  Filled 2023-04-17: qty 360, 90d supply, fill #0

## 2023-04-23 ENCOUNTER — Other Ambulatory Visit: Payer: Self-pay

## 2023-04-23 MED ORDER — INFLUENZA VIRUS VACC SPLIT PF (FLUZONE) 0.5 ML IM SUSY
0.5000 mL | PREFILLED_SYRINGE | Freq: Once | INTRAMUSCULAR | 0 refills | Status: AC
  Filled 2023-04-23: qty 0.5, 1d supply, fill #0

## 2023-04-24 ENCOUNTER — Other Ambulatory Visit: Payer: Self-pay

## 2023-05-16 DIAGNOSIS — E119 Type 2 diabetes mellitus without complications: Secondary | ICD-10-CM | POA: Diagnosis not present

## 2023-05-31 ENCOUNTER — Other Ambulatory Visit (HOSPITAL_COMMUNITY): Payer: Self-pay

## 2023-06-02 ENCOUNTER — Other Ambulatory Visit (HOSPITAL_COMMUNITY): Payer: Self-pay

## 2023-06-02 MED ORDER — LISINOPRIL-HYDROCHLOROTHIAZIDE 20-12.5 MG PO TABS
1.0000 | ORAL_TABLET | Freq: Every day | ORAL | 2 refills | Status: AC
  Filled 2023-06-02: qty 90, 90d supply, fill #0
  Filled 2023-08-17: qty 90, 90d supply, fill #1

## 2023-06-09 ENCOUNTER — Other Ambulatory Visit (HOSPITAL_COMMUNITY): Payer: Self-pay

## 2023-06-09 DIAGNOSIS — E1169 Type 2 diabetes mellitus with other specified complication: Secondary | ICD-10-CM | POA: Diagnosis not present

## 2023-06-09 DIAGNOSIS — E785 Hyperlipidemia, unspecified: Secondary | ICD-10-CM | POA: Diagnosis not present

## 2023-06-09 DIAGNOSIS — N183 Chronic kidney disease, stage 3 unspecified: Secondary | ICD-10-CM | POA: Diagnosis not present

## 2023-06-09 DIAGNOSIS — I129 Hypertensive chronic kidney disease with stage 1 through stage 4 chronic kidney disease, or unspecified chronic kidney disease: Secondary | ICD-10-CM | POA: Diagnosis not present

## 2023-06-09 DIAGNOSIS — F4321 Adjustment disorder with depressed mood: Secondary | ICD-10-CM | POA: Diagnosis not present

## 2023-06-09 MED ORDER — LISINOPRIL-HYDROCHLOROTHIAZIDE 20-12.5 MG PO TABS
1.0000 | ORAL_TABLET | Freq: Every day | ORAL | 1 refills | Status: AC
  Filled 2023-06-09: qty 90, 90d supply, fill #0

## 2023-06-09 MED ORDER — ATORVASTATIN CALCIUM 20 MG PO TABS
20.0000 mg | ORAL_TABLET | Freq: Every evening | ORAL | 3 refills | Status: AC
  Filled 2023-06-09: qty 90, 90d supply, fill #0

## 2023-06-09 MED ORDER — METFORMIN HCL ER 500 MG PO TB24
1000.0000 mg | ORAL_TABLET | Freq: Two times a day (BID) | ORAL | 1 refills | Status: AC
  Filled 2023-06-09 – 2023-08-17 (×2): qty 360, 90d supply, fill #0

## 2023-07-21 DIAGNOSIS — M545 Low back pain, unspecified: Secondary | ICD-10-CM | POA: Diagnosis not present

## 2023-08-18 ENCOUNTER — Other Ambulatory Visit: Payer: Self-pay

## 2023-08-18 ENCOUNTER — Other Ambulatory Visit (HOSPITAL_COMMUNITY): Payer: Self-pay

## 2023-10-07 DIAGNOSIS — Z1231 Encounter for screening mammogram for malignant neoplasm of breast: Secondary | ICD-10-CM | POA: Diagnosis not present

## 2023-10-27 ENCOUNTER — Other Ambulatory Visit: Payer: Self-pay

## 2023-10-27 ENCOUNTER — Other Ambulatory Visit (HOSPITAL_COMMUNITY): Payer: Self-pay

## 2023-10-27 DIAGNOSIS — N183 Chronic kidney disease, stage 3 unspecified: Secondary | ICD-10-CM | POA: Diagnosis not present

## 2023-10-27 DIAGNOSIS — I129 Hypertensive chronic kidney disease with stage 1 through stage 4 chronic kidney disease, or unspecified chronic kidney disease: Secondary | ICD-10-CM | POA: Diagnosis not present

## 2023-10-27 DIAGNOSIS — R87629 Unspecified abnormal cytological findings in specimens from vagina: Secondary | ICD-10-CM | POA: Diagnosis not present

## 2023-10-27 DIAGNOSIS — E1169 Type 2 diabetes mellitus with other specified complication: Secondary | ICD-10-CM | POA: Diagnosis not present

## 2023-10-27 DIAGNOSIS — E785 Hyperlipidemia, unspecified: Secondary | ICD-10-CM | POA: Diagnosis not present

## 2023-10-27 DIAGNOSIS — E559 Vitamin D deficiency, unspecified: Secondary | ICD-10-CM | POA: Diagnosis not present

## 2023-10-27 DIAGNOSIS — J309 Allergic rhinitis, unspecified: Secondary | ICD-10-CM | POA: Diagnosis not present

## 2023-10-27 DIAGNOSIS — R87610 Atypical squamous cells of undetermined significance on cytologic smear of cervix (ASC-US): Secondary | ICD-10-CM | POA: Diagnosis not present

## 2023-10-27 DIAGNOSIS — M109 Gout, unspecified: Secondary | ICD-10-CM | POA: Diagnosis not present

## 2023-10-27 DIAGNOSIS — Z0000 Encounter for general adult medical examination without abnormal findings: Secondary | ICD-10-CM | POA: Diagnosis not present

## 2023-10-27 MED ORDER — ATORVASTATIN CALCIUM 20 MG PO TABS
20.0000 mg | ORAL_TABLET | Freq: Every evening | ORAL | 3 refills | Status: AC
  Filled 2023-10-27 – 2023-10-28 (×2): qty 90, 90d supply, fill #0
  Filled 2024-07-09: qty 90, 90d supply, fill #1

## 2023-10-27 MED ORDER — LISINOPRIL-HYDROCHLOROTHIAZIDE 20-12.5 MG PO TABS
1.0000 | ORAL_TABLET | Freq: Every day | ORAL | 1 refills | Status: AC
  Filled 2023-10-27 – 2023-10-28 (×2): qty 90, 90d supply, fill #0

## 2023-10-27 MED ORDER — OZEMPIC (0.25 OR 0.5 MG/DOSE) 2 MG/3ML ~~LOC~~ SOPN
PEN_INJECTOR | SUBCUTANEOUS | 1 refills | Status: DC
Start: 2023-10-27 — End: 2024-03-09
  Filled 2023-10-27: qty 3, 28d supply, fill #0
  Filled 2023-12-13 – 2023-12-15 (×2): qty 3, 28d supply, fill #1
  Filled 2024-01-16: qty 3, 28d supply, fill #2
  Filled 2024-02-11: qty 3, 28d supply, fill #3

## 2023-10-27 MED ORDER — METFORMIN HCL ER 500 MG PO TB24
1000.0000 mg | ORAL_TABLET | Freq: Two times a day (BID) | ORAL | 1 refills | Status: AC
  Filled 2023-10-27 – 2023-10-29 (×2): qty 360, 90d supply, fill #0

## 2023-10-28 ENCOUNTER — Other Ambulatory Visit (HOSPITAL_COMMUNITY): Payer: Self-pay

## 2023-10-29 ENCOUNTER — Other Ambulatory Visit (HOSPITAL_COMMUNITY): Payer: Self-pay

## 2023-12-13 ENCOUNTER — Other Ambulatory Visit (HOSPITAL_COMMUNITY): Payer: Self-pay

## 2023-12-15 ENCOUNTER — Other Ambulatory Visit (HOSPITAL_COMMUNITY): Payer: Self-pay

## 2024-01-16 ENCOUNTER — Other Ambulatory Visit (HOSPITAL_COMMUNITY): Payer: Self-pay

## 2024-02-11 ENCOUNTER — Other Ambulatory Visit (HOSPITAL_COMMUNITY): Payer: Self-pay

## 2024-02-12 ENCOUNTER — Encounter: Payer: Self-pay | Admitting: Pharmacist

## 2024-02-12 ENCOUNTER — Other Ambulatory Visit (HOSPITAL_COMMUNITY): Payer: Self-pay

## 2024-02-12 ENCOUNTER — Encounter (HOSPITAL_COMMUNITY): Payer: Self-pay

## 2024-02-12 ENCOUNTER — Other Ambulatory Visit: Payer: Self-pay

## 2024-02-12 MED ORDER — FREESTYLE LANCETS MISC
3 refills | Status: AC
  Filled 2024-02-12: qty 100, 50d supply, fill #0

## 2024-02-12 MED ORDER — EASY TOUCH ALCOHOL PREP MEDIUM 70 % PADS
MEDICATED_PAD | 1 refills | Status: AC
  Filled 2024-02-12: qty 100, 50d supply, fill #0

## 2024-02-12 MED ORDER — FREESTYLE LITE TEST VI STRP
ORAL_STRIP | 3 refills | Status: AC
  Filled 2024-02-12: qty 150, 75d supply, fill #0

## 2024-02-13 ENCOUNTER — Other Ambulatory Visit (HOSPITAL_COMMUNITY): Payer: Self-pay

## 2024-03-06 ENCOUNTER — Other Ambulatory Visit (HOSPITAL_COMMUNITY): Payer: Self-pay

## 2024-03-09 ENCOUNTER — Other Ambulatory Visit (HOSPITAL_COMMUNITY): Payer: Self-pay

## 2024-03-09 MED ORDER — OZEMPIC (0.25 OR 0.5 MG/DOSE) 2 MG/3ML ~~LOC~~ SOPN
0.2500 mg | PEN_INJECTOR | SUBCUTANEOUS | 1 refills | Status: AC
  Filled 2024-03-09: qty 3, 28d supply, fill #0
  Filled 2024-04-13: qty 3, 28d supply, fill #1

## 2024-03-10 ENCOUNTER — Other Ambulatory Visit (HOSPITAL_COMMUNITY): Payer: Self-pay

## 2024-03-12 ENCOUNTER — Other Ambulatory Visit (HOSPITAL_COMMUNITY): Payer: Self-pay

## 2024-03-16 ENCOUNTER — Other Ambulatory Visit (HOSPITAL_COMMUNITY): Payer: Self-pay

## 2024-03-17 ENCOUNTER — Other Ambulatory Visit (HOSPITAL_COMMUNITY): Payer: Self-pay

## 2024-03-19 ENCOUNTER — Other Ambulatory Visit (HOSPITAL_COMMUNITY): Payer: Self-pay

## 2024-04-28 ENCOUNTER — Other Ambulatory Visit (HOSPITAL_COMMUNITY): Payer: Self-pay

## 2024-04-28 DIAGNOSIS — N183 Chronic kidney disease, stage 3 unspecified: Secondary | ICD-10-CM | POA: Diagnosis not present

## 2024-04-28 DIAGNOSIS — I129 Hypertensive chronic kidney disease with stage 1 through stage 4 chronic kidney disease, or unspecified chronic kidney disease: Secondary | ICD-10-CM | POA: Diagnosis not present

## 2024-04-28 DIAGNOSIS — E1169 Type 2 diabetes mellitus with other specified complication: Secondary | ICD-10-CM | POA: Diagnosis not present

## 2024-04-28 DIAGNOSIS — E785 Hyperlipidemia, unspecified: Secondary | ICD-10-CM | POA: Diagnosis not present

## 2024-04-28 MED ORDER — METFORMIN HCL ER 500 MG PO TB24
1000.0000 mg | ORAL_TABLET | Freq: Two times a day (BID) | ORAL | 1 refills | Status: DC
  Filled 2024-04-28: qty 360, 90d supply, fill #0

## 2024-04-28 MED ORDER — OZEMPIC (1 MG/DOSE) 4 MG/3ML ~~LOC~~ SOPN
1.0000 mg | PEN_INJECTOR | SUBCUTANEOUS | 1 refills | Status: AC
  Filled 2024-04-28 (×2): qty 9, 84d supply, fill #0
  Filled 2024-05-06: qty 3, 28d supply, fill #0
  Filled 2024-06-08: qty 3, 28d supply, fill #1
  Filled 2024-07-02: qty 3, 28d supply, fill #2

## 2024-04-30 ENCOUNTER — Other Ambulatory Visit (HOSPITAL_COMMUNITY): Payer: Self-pay

## 2024-05-06 ENCOUNTER — Other Ambulatory Visit (HOSPITAL_COMMUNITY): Payer: Self-pay

## 2024-06-08 ENCOUNTER — Other Ambulatory Visit (HOSPITAL_COMMUNITY): Payer: Self-pay

## 2024-06-08 MED ORDER — AMOXICILLIN 500 MG PO CAPS
500.0000 mg | ORAL_CAPSULE | Freq: Three times a day (TID) | ORAL | 0 refills | Status: AC
  Filled 2024-06-08: qty 21, 7d supply, fill #0

## 2024-07-02 ENCOUNTER — Other Ambulatory Visit (HOSPITAL_COMMUNITY): Payer: Self-pay
